# Patient Record
Sex: Female | Born: 1955 | Race: White | Hispanic: No | Marital: Married | Smoking: Current every day smoker
Health system: Southern US, Academic
[De-identification: ages and names within clinical notes are randomized; demographics above are authoritative.]

## PROBLEM LIST (undated history)

## (undated) DIAGNOSIS — I10 Essential (primary) hypertension: Secondary | ICD-10-CM

## (undated) DIAGNOSIS — M3500 Sicca syndrome, unspecified: Secondary | ICD-10-CM

## (undated) DIAGNOSIS — I499 Cardiac arrhythmia, unspecified: Secondary | ICD-10-CM

## (undated) DIAGNOSIS — E119 Type 2 diabetes mellitus without complications: Secondary | ICD-10-CM

## (undated) DIAGNOSIS — E079 Disorder of thyroid, unspecified: Secondary | ICD-10-CM

## (undated) DIAGNOSIS — E785 Hyperlipidemia, unspecified: Secondary | ICD-10-CM

## (undated) HISTORY — DX: Type 2 diabetes mellitus without complications (CMS HCC): E11.9

## (undated) HISTORY — DX: Essential (primary) hypertension: I10

## (undated) HISTORY — PX: GALLBLADDER SURGERY: SHX652

## (undated) HISTORY — PX: HX APPENDECTOMY: SHX54

## (undated) HISTORY — DX: Cardiac arrhythmia, unspecified: I49.9

## (undated) HISTORY — DX: Disorder of thyroid, unspecified: E07.9

## (undated) HISTORY — DX: Sjogren syndrome, unspecified (CMS HCC): M35.00

## (undated) HISTORY — DX: Hyperlipidemia, unspecified: E78.5

---

## 2008-09-06 ENCOUNTER — Other Ambulatory Visit (HOSPITAL_COMMUNITY): Payer: Self-pay

## 2011-08-27 ENCOUNTER — Ambulatory Visit (INDEPENDENT_AMBULATORY_CARE_PROVIDER_SITE_OTHER): Payer: BC Managed Care – PPO | Admitting: Ophthalmology

## 2011-10-01 ENCOUNTER — Ambulatory Visit (INDEPENDENT_AMBULATORY_CARE_PROVIDER_SITE_OTHER): Payer: BC Managed Care – PPO | Admitting: Ophthalmology

## 2017-09-24 DIAGNOSIS — E039 Hypothyroidism, unspecified: Secondary | ICD-10-CM | POA: Insufficient documentation

## 2017-09-24 DIAGNOSIS — R5383 Other fatigue: Secondary | ICD-10-CM | POA: Insufficient documentation

## 2017-09-24 DIAGNOSIS — I10 Essential (primary) hypertension: Secondary | ICD-10-CM | POA: Insufficient documentation

## 2017-09-24 DIAGNOSIS — F17210 Nicotine dependence, cigarettes, uncomplicated: Secondary | ICD-10-CM | POA: Insufficient documentation

## 2018-09-23 DIAGNOSIS — E119 Type 2 diabetes mellitus without complications: Secondary | ICD-10-CM | POA: Insufficient documentation

## 2018-09-23 DIAGNOSIS — M3500 Sicca syndrome, unspecified: Secondary | ICD-10-CM | POA: Insufficient documentation

## 2018-10-06 ENCOUNTER — Other Ambulatory Visit (HOSPITAL_COMMUNITY): Payer: Self-pay

## 2018-10-06 DIAGNOSIS — I6529 Occlusion and stenosis of unspecified carotid artery: Secondary | ICD-10-CM

## 2018-10-23 ENCOUNTER — Ambulatory Visit (HOSPITAL_BASED_OUTPATIENT_CLINIC_OR_DEPARTMENT_OTHER): Payer: BC Managed Care – PPO | Admitting: Vascular Surgery

## 2018-10-23 ENCOUNTER — Other Ambulatory Visit: Payer: Self-pay

## 2018-10-23 ENCOUNTER — Encounter (HOSPITAL_COMMUNITY): Payer: Self-pay | Admitting: Vascular Surgery

## 2018-10-23 ENCOUNTER — Ambulatory Visit
Admission: RE | Admit: 2018-10-23 | Discharge: 2018-10-23 | Disposition: A | Discharge status: Home / Self Care | Payer: BC Managed Care – PPO | Source: Ambulatory Visit | Attending: Vascular Surgery | Admitting: Vascular Surgery

## 2018-10-23 DIAGNOSIS — F17210 Nicotine dependence, cigarettes, uncomplicated: Secondary | ICD-10-CM | POA: Insufficient documentation

## 2018-10-23 DIAGNOSIS — I6529 Occlusion and stenosis of unspecified carotid artery: Secondary | ICD-10-CM

## 2018-10-23 DIAGNOSIS — I6522 Occlusion and stenosis of left carotid artery: Secondary | ICD-10-CM | POA: Insufficient documentation

## 2018-10-23 DIAGNOSIS — E119 Type 2 diabetes mellitus without complications: Secondary | ICD-10-CM | POA: Insufficient documentation

## 2018-10-23 MED ORDER — ATORVASTATIN 20 MG TABLET
20.0000 mg | ORAL_TABLET | Freq: Every day | ORAL | 4 refills | Status: AC
Start: 2018-10-23 — End: ?

## 2018-10-23 NOTE — Progress Notes (Signed)
[facility]  Clinic History and Physical    Date: 10/23/2018  Patient: Kerry Riggs  DOB: [birth year]  PCP: Brandon Melnick, MD    Chief Complaint:   Chief Complaint   Patient presents with   . Carotid Stenosis       Subjective:     HPI: Kerry Riggs is a 63 y.o. right hand dominant White female who presents for evaluation of incidental finding of >70% carotid stenosis during work up for ear pain. The patient has a history significant for DM (recently diagnosed) and smoking (1ppd).     PMH:   Past Medical History:   Diagnosis Date   . Diabetes mellitus (CMS [place])    . Dyslipidemia    . Essential hypertension    . Irregular heartbeat    . Sjogren's syndrome (CMS [place])    . Thyroid disease            Family Hx:  Family Medical History:     Problem Relation (Age of Onset)    Coronary Artery Disease Brother    Hypertension (High Blood Pressure) Father              Medications:  Current Outpatient Medications   Medication Sig Dispense Refill   . aspirin (ECOTRIN) 81 mg Oral Tablet, Delayed Release (E.C.) Take 81 mg by mouth Once a day     . Candesartan-Hydrochlorothiazid 32-25 mg Oral Tablet Patient Takes 1/2 tab twice daily     . Cholecalciferol, Vitamin D3, (VITAMIN D) 25 mcg (1,000 unit) Oral Capsule Take by mouth     . Coenzyme Q10 (CO Q-10) 10 mg Oral Capsule Take by mouth     . ferrous sulfate (FERATAB) 324 mg (65 mg iron) Oral Tablet, Delayed Release (E.C.) Take 324 mg by mouth     . L. acidophilus/L. rhamnosus (PROBIOTIC ORAL) Take by mouth     . levothyroxine (SYNTHROID) 88 mcg Oral Tablet TAKE 1 TABLET BY MOUTH ONCE DAILY     . loratadine (CLARITIN) 10 mg Oral Tablet Take 10 mg by mouth Once a day     . metFORMIN (GLUCOPHAGE) 500 mg Oral Tablet TAKE 1 TABLET BY MOUTH ONCE DAILY     . metoprolol succinate (TOPROL-XL) 25 mg Oral Tablet Sustained Release 24 hr Take 12 mg by mouth Once a day Pt states that she takes 12 mg occasionally.     . montelukast (SINGULAIR) 10 mg Oral Tablet TAKE 1  TABLET BY MOUTH ONCE DAILY IN THE EVENING       No current facility-administered medications for this visit.        Allergies:  Allergies   Allergen Reactions   . Penicillins    . Sulfa (Sulfonamides)        Past Surgical Hx:  Past Surgical History:   Procedure Laterality Date   . GALLBLADDER SURGERY     . HX APPENDECTOMY             Social Hx:  Social History     Socioeconomic History   . Marital status: Married     Spouse name: Not on file   . Number of children: Not on file   . Years of education: Not on file   . Highest education level: Not on file   Occupational History   . Not on file   Social Needs   . Financial resource strain: Not on file   . Food insecurity     Worry:  Not on file     Inability: Not on file   . Transportation needs     Medical: Not on file     Non-medical: Not on file   Tobacco Use   . Smoking status: Current Every Day Smoker     Packs/day: 1.00     Years: 50.00     Pack years: 50.00     Types: Cigarettes     Start date: 10/22/1968   . Smokeless tobacco: Never Used   Substance and Sexual Activity   . Alcohol use: Not Currently   . Drug use: Not on file   . Sexual activity: Not on file   Lifestyle   . Physical activity     Days per week: Not on file     Minutes per session: Not on file   . Stress: Not on file   Relationships   . Social Wellsite geologistconnections     Talks on phone: Not on file     Gets together: Not on file     Attends religious service: Not on file     Active member of club or organization: Not on file     Attends meetings of clubs or organizations: Not on file     Relationship status: Not on file   . Intimate partner violence     Fear of current or ex partner: Not on file     Emotionally abused: Not on file     Physically abused: Not on file     Forced sexual activity: Not on file   Other Topics Concern   . Not on file   Social History Narrative   . 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  HEENT: as in HPI  Respiratory: negative for cough and shortness of breath  Cardiovascular: negative for chest pain, negative for claudication  Gastrointestinal: negative for post prandial pain  Genitourinary: negative for dysuria, continues to urinate  Musculoskeletal: positive for back problems/sciatic nerve  Neurological: negative for unilateral weakness or monocular blindness  Integumentary: negative for wounds or ulcers    Objective:    PHYSICAL EXAM:  Vitals: BP (!) 167/82   Pulse 76   Temp 36.6 C (97.9 F) (Thermal Scan)   Ht 1.565 m (5' 1.61")   Wt 68.1 kg (150 lb 2.1 oz)   SpO2 94%   BMI 27.80 kg/m       General: AA&O X3 Well developed and well nourished in no acute distress   HENT: Head is normocephalic, atraumatic   Eyes: Conjunctiva clear., Pupils equal and round. , Sclera non-icteric. EOMI  Neck: Normal ROM, Supple, symmetrical  Lungs: Effort normal, clear to auscultation bilaterally.   Cardiovascular: Heart regular rate and rhythm, S1, S2 normal, no murmur, click, rub or gallop  Vascular:  no carotid bruits  Left dorsalis pedis artery:  2+ (normal), Right dorsalis pedis artery:  2+ (normal),    Left posterior tibial artery: 2+ (normal) Right posterior tibeal artery:  2+ (normal)   Extremities: Feet without structural abnormality. 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   Skin: First toes onychomycosis bilaterally.  Neurologic: Grossly normal, no focal neuro deficit, normal coordination and gait  Psychiatric: normal affect, behavior, memory, thought content, judgement, and speech    DATA:   I independently reviewed and interpreted the images.  DIAGNOSTIC STUDIES REVIEWED:  Carotid duplex from outside hospital report reviewed, with PSV over 300 and EDV over 100 on the left.   Carotid duplex performed at our hospital 10/23/2018: consistent with these results.       Assessment:  63 year old female with >70% asymptomatic left carotid stenosis    Plan:  1)  Will  follow up in the next few weeks with CTA to assess anatomy and to get preop testing with plans likely for CEA.   2)  Patient given information on diabetic foot care, will refer to podiatry due to evidence of LOPS and onychomycosis.   3) Counseled patient about the importance of smoking cessation, particularly in the setting of their vascular disease (2-10 minutes spent counseling). [facility]. Referral will be faxed.   4) Will initiate statin for pleitropic effects/medical optimization, Lipitor 20 mg. Will have PCP follow up labs.       Patient was given the opportunity to ask questions and those questions were answered to their satisfaction. Instructed to call with any further questions or concerns.     Troy SineSamantha Maveryk Renstrom, MD

## 2018-10-26 LAB — CAROTID ARTERY DUPLEX
Left CCA dist dias: 19 cm/s
Left CCA dist sys: 86 cm/s
Left CCA mid dias: 13
Left CCA mid dias: 21
Left CCA mid sys: 72
Left CCA prox dias: 18 cm/s
Left CCA prox sys: 95 cm/s
Left Carotid Bubl Sys: 80
Left Carotid Bulb Dias: 21
Left ECA sys: 198 cm/s
Left ICA dist dias: 24 cm/s
Left ICA dist sys: 63 cm/s
Left ICA mid dias: 26 cm/s
Left ICA mid sys: 77 cm/s
Left ICA prox dias: 146 cm/s
Left ICA prox sys: 327 cm/s
Left ICA/CCA sys: 3.8
Left vertebral sys: 57 cm/s
Right CCA dist dias: 18 cm/s
Right CCA mid sys: 94
Right CCA prox dias: 20 cm/s
Right CCA prox sys: 86 cm/s
Right Carotid Bulb Dias: 26
Right Carotid Bulb Sys: 98
Right ICA dist dias: 30 cm/s
Right ICA dist sys: 87 cm/s
Right ICA mid dias: 31 cm/s
Right ICA mid sys: 91 cm/s
Right ICA prox dias: 31 cm/s
Right ICA prox sys: 119 cm/s
Right ICA/CCA sys: 1.5 cm/s
Right cca dist sys: 78 cm/s
Right eca sys: 180 cm/s
Right vertebral sys: 45 cm/s

## 2018-11-09 ENCOUNTER — Other Ambulatory Visit (HOSPITAL_COMMUNITY): Payer: Self-pay

## 2018-11-09 ENCOUNTER — Ambulatory Visit (HOSPITAL_BASED_OUTPATIENT_CLINIC_OR_DEPARTMENT_OTHER)
Admission: RE | Admit: 2018-11-09 | Discharge: 2018-11-09 | Disposition: A | Discharge status: Home / Self Care | Payer: BC Managed Care – PPO | Source: Ambulatory Visit

## 2018-11-09 ENCOUNTER — Ambulatory Visit
Admission: RE | Admit: 2018-11-09 | Discharge: 2018-11-09 | Disposition: A | Discharge status: Home / Self Care | Payer: BC Managed Care – PPO | Source: Ambulatory Visit | Attending: Vascular Surgery | Admitting: Vascular Surgery

## 2018-11-09 ENCOUNTER — Other Ambulatory Visit: Payer: Self-pay

## 2018-11-09 DIAGNOSIS — I6529 Occlusion and stenosis of unspecified carotid artery: Secondary | ICD-10-CM

## 2018-11-09 DIAGNOSIS — I6523 Occlusion and stenosis of bilateral carotid arteries: Secondary | ICD-10-CM | POA: Insufficient documentation

## 2018-11-09 DIAGNOSIS — I708 Atherosclerosis of other arteries: Secondary | ICD-10-CM | POA: Insufficient documentation

## 2018-11-09 DIAGNOSIS — R911 Solitary pulmonary nodule: Secondary | ICD-10-CM | POA: Insufficient documentation

## 2018-11-09 MED ORDER — IOPAMIDOL 370 MG IODINE/ML (76 %) INTRAVENOUS SOLUTION
50.00 mL | INTRAVENOUS | Status: AC
Start: 2018-11-09 — End: 2018-11-09
  Administered 2018-11-09: 16:00:00 50 mL via INTRAVENOUS

## 2018-11-09 NOTE — Nurses Notes (Signed)
Patient in for CTA intracranial w IV contrast. Patient states that she had an allergic reaction to IV contrast 40+ years ago and has not had IV contrast since then. Patient doesn't remember what type of reaction she had. States that all she remembers is that she woke up from her surgery and was extremely sick to her stomach and she had been told that she had an allergic reaction to the dye. Paged and notified Dr. Anthony Sar. Per Dr. Anthony Sar and Dr. Jasper Riling, okay to proceed with patient's scan. Will accompany and monitor patient for any s/s of reaction during scan.

## 2018-11-11 LAB — POC ISTAT CREATININE (RESULT): CREATININE, POC: 0.8 mg/dL (ref 0.49–1.10)

## 2018-11-13 ENCOUNTER — Encounter (HOSPITAL_COMMUNITY): Payer: Self-pay | Admitting: Vascular Surgery

## 2018-11-16 ENCOUNTER — Other Ambulatory Visit (HOSPITAL_COMMUNITY): Payer: Self-pay

## 2018-11-16 DIAGNOSIS — R918 Other nonspecific abnormal finding of lung field: Secondary | ICD-10-CM

## 2018-11-27 ENCOUNTER — Encounter (HOSPITAL_COMMUNITY): Payer: Self-pay | Admitting: Vascular Surgery

## 2018-11-27 ENCOUNTER — Encounter (INDEPENDENT_AMBULATORY_CARE_PROVIDER_SITE_OTHER): Payer: Self-pay

## 2018-11-27 ENCOUNTER — Encounter (HOSPITAL_BASED_OUTPATIENT_CLINIC_OR_DEPARTMENT_OTHER): Payer: Self-pay | Admitting: Thoracic Surgery (Cardiothoracic Vascular Surgery)

## 2018-11-27 ENCOUNTER — Ambulatory Visit (HOSPITAL_BASED_OUTPATIENT_CLINIC_OR_DEPARTMENT_OTHER): Payer: BC Managed Care – PPO | Admitting: Thoracic Surgery (Cardiothoracic Vascular Surgery)

## 2018-11-27 ENCOUNTER — Ambulatory Visit: Payer: BC Managed Care – PPO | Attending: Vascular Surgery | Admitting: Vascular Surgery

## 2018-11-27 ENCOUNTER — Other Ambulatory Visit: Payer: Self-pay

## 2018-11-27 VITALS — BP 149/87 | HR 95 | Temp 97.3°F | Resp 18 | Ht 62.0 in | Wt 144.6 lb

## 2018-11-27 VITALS — BP 177/97 | HR 91 | Temp 97.5°F | Ht 61.5 in | Wt 145.1 lb

## 2018-11-27 DIAGNOSIS — Z01818 Encounter for other preprocedural examination: Secondary | ICD-10-CM

## 2018-11-27 DIAGNOSIS — F17210 Nicotine dependence, cigarettes, uncomplicated: Secondary | ICD-10-CM | POA: Insufficient documentation

## 2018-11-27 DIAGNOSIS — R918 Other nonspecific abnormal finding of lung field: Secondary | ICD-10-CM

## 2018-11-27 DIAGNOSIS — M3500 Sicca syndrome, unspecified: Secondary | ICD-10-CM | POA: Insufficient documentation

## 2018-11-27 DIAGNOSIS — I6522 Occlusion and stenosis of left carotid artery: Secondary | ICD-10-CM | POA: Insufficient documentation

## 2018-11-27 DIAGNOSIS — R0600 Dyspnea, unspecified: Secondary | ICD-10-CM

## 2018-11-27 DIAGNOSIS — I6529 Occlusion and stenosis of unspecified carotid artery: Secondary | ICD-10-CM

## 2018-11-27 DIAGNOSIS — I2510 Atherosclerotic heart disease of native coronary artery without angina pectoris: Secondary | ICD-10-CM

## 2018-11-27 DIAGNOSIS — E119 Type 2 diabetes mellitus without complications: Secondary | ICD-10-CM | POA: Insufficient documentation

## 2018-11-27 DIAGNOSIS — I10 Essential (primary) hypertension: Secondary | ICD-10-CM | POA: Insufficient documentation

## 2018-11-27 NOTE — Patient Instructions (Signed)
Thoracic Surgery  [address], [address]  [place], [place] [address]  Phone / [phone] ([place])  Fax / [phone]    Surgery Date:  12/24/2018, this date is subject to change due to the current pandemic.  You will be notified of any changes or any further pre-operative requirements.     Arrival Time for Surgery:  Someone will contact you by telephone one business day prior to surgery between 2:00 pm and 6:00 pm to discuss your arrival time for surgery.   (If you do not receive a phone call by 6:00 pm, please call [phone], option 4.)      Pre-Surgery Appointments:    Pre Admission Testing Appointment: (You can eat and drink the day of this PAT appointment) You will receive a phone call with the date and time of this appointment at [facility] on 12/22/2018.    COVID Testing:  Must be completed 2-3 days (48-72 hours) before the surgery.  You are to complete COVID testing at the [facility] site on 12/22/2018.      PFT (lung test) Appointment:  You will receive a phone call with the date and time of this appointment.    TTE (ECHO, heart test) Appointment:  You will receive a phone call with the date and time of this appointment.    PET Scan Appointment:  You will receive a phone call with the date and time of this appointment.    Stress Test Appointment:  You will receive a phone call with the date and time of this appointment.      Pre-Operative Assessment for COVID-19 and Patient Instructions: Tier 1      Nursing Assessment of Patient Factors:    1. Has the patient travelled out of state or to a community with high prevalence rate of COVID-19 within the previous 14 days?no  2. Has the patient been in household contact to a COVID-19 positive person? no  3. Does the patient reside in a nursing home with a known COVID-19 outbreak? no  4. Is the patient in prison? no  5. Does the patient have infiltrates on imaging not explained by other etiology? no  6. Per the CDC guidelines: Does  the patient have symptoms of a potential COVID-19 infection including fever not explained by other etiology, cough, shortness of breath, chills, myalgias, new loss of taste or smell, vomiting or diarrhea, and/or sore throat?no  7. Is the patient having any of the following procedures/surgeries? yes  o Intranasal/paranasal: including sinuses, septum, turbinates   o mastoid surgery which involves drilling   o Oral cavity: including dental, oral surgery   o oropharynx: including tonsils, EGD, TEE (done as intra-op or stand-alone procedure)   o hypopharynx   o larynx and trachea: including bronchoscopy   o lung resections   o esophageal resections   o transplant surgeries: solid organ and BMT   o CPAP titration in sleep lab studies     If YES was answered to any of the above questions, patient should be tested for COVID-19 pre-operatively no matter what type of surgery or anesthesia.       Instructions for ordering pre-operative COVID-19 test:    1. Order test in Epic: 'COVID-19 Screening - PREOP' if the test is to be completed at a [facility] locations:     Please call [place] [phone], option 4, then option 1) to confirm site times and dates of availability.  Days and times  listed below are subject to change.    [place]- Hours: 10 am - 6 pm (M-T) 10 am - 4 pm (W-F) 10 am - 2 pm (Sat)   [address]  [place], [place] [id number]      [place]- Call [phone] prior to visiting.   [address]  [place], [place] [id number]    2. Test needs to be completed 2-3 days (48-72 hours) prior to surgery.  Please [facility] COVID [facility] on 12/22/2018.  Testing completed before or after this time frame will not be counted for your pre-surgery requirement.    3.  If you have questions regarding COVID testing site hours or location please call the COVID Nurse Navigator at [phone], choose option 4, then choose option 1.    4. The  patient needs to remain in self-quarantine after COVID swab is completed.  Self-Quarantine includes:  Stay at home, no visitors, frequent handwashing, social distancing with other household members.      COVID Pre-operative Patient Instructions:    . The patient is to remain in self-quarantine for 48 hours prior to their surgical date or after the COVID test has been completed.   o If the patient is unable to fulfill the 48 hours requirement and please contact Thoracic Surgery for further instructions.    Surgery Instructions:  . ERAS- (Enhanced Recovery After Surgery) Please follow instructions given to you in Pre-Admission Testing and drink the protein drinks as instructed.  The drink the AM of surgery (clear ensure or 20 oz. of water for diabetic patients) please stop 2 hours prior to your arrival time.  (4 hours before surgery time)  Please report to the 1st floor of [facility] in [place] today and ask for your protein drinks for surgery.    . Please stop all vitamins, fish oils, herbal supplements, NSAIDS (such as Motrin, Ibuprofen, Aleve, Naprosyn, Naproxen, Mobic, Meloxicam, Celebrex, Celecoxib) 7 days before the surgery    . Please stop Aspirin 5 days before the surgery.    . All other Medication instructions will be given at the Pre-admission testing appointment by Anesthesia.     . Nothing to eat or drink after midnight the day of surgery.       Contact Information for Questions and Paperwork:     If you have any questions please call the Thoracic Surgery Nurses at [phone] and ask to speak Noemi ChapelAshley Striner-Jacobs, RN or Perrin SmackKrystal Pheonix Clinkscale, RN.     If you have any paperwork that needs to be completed for medical leave, please fax your paperwork to [phone], ATTN: Thoracic Surgery.     Consent completed in clinic.yes Consent to be completed AM of surgery.No

## 2018-11-27 NOTE — Progress Notes (Signed)
Thoracic Oncology Clinic, [place] [address]  [phone]    Visit Date: 11/27/2018    Referring Provider: No ref. provider found  PCP: Brandon Melnick, MD    Reason For Visit: Right upper lobe spiculated lung mass    History of Present Illness:  Kerry Riggs is a 63 y.o. White female who presents as a referral for evaluation management of right upper lobe spiculated lung nodule.  She affirms a 100 pack-year history of tobacco use as well as type 2 diabetes, hypertension, and hyperlipidemia.  She was undergoing a workup for carotid artery stenosis and had a CT scan of her neck performed.  The right upper lobe spiculated nodule was found incidentally on that CT scan and she was therefore referred to [place] for evaluation.  She denies any symptoms including fevers, chills, night sweats, unintentional weight loss, cough, shortness of breath, chest pain, or any other associated symptoms.  We discussed her tobacco use at length and he had recommended cessation of tobacco use.  She states that she is currently using a patch and it is helping some.  She has only smoked 2 or 3 cigarettes today.    History:   Past Medical History:   Diagnosis Date   . Diabetes mellitus (CMS [place])    . Dyslipidemia    . Essential hypertension    . Irregular heartbeat    . Sjogren's syndrome (CMS [place])    . Thyroid disease            Past Surgical History:   Procedure Laterality Date   . GALLBLADDER SURGERY     . HX APPENDECTOMY             Social History     Socioeconomic History   . Marital status: Married     Spouse name: Not on file   . Number of children: Not on file   . Years of education: Not on file   . Highest education level: Not on file   Tobacco Use   . Smoking status: Current Every Day Smoker     Packs/day: 1.00     Years: 50.00     Pack years: 50.00     Types: Cigarettes     Start date: 10/22/1968   . Smokeless tobacco: Never Used   Substance and Sexual Activity   . Alcohol use: Not Currently      . Drug use: Never     Family Medical History:     Problem Relation (Age of Onset)    Coronary Artery Disease Brother    Hypertension (High Blood Pressure) Father              Current Outpatient Medications:   Current Outpatient Medications   Medication Sig Dispense Refill   . aspirin (ECOTRIN) 81 mg Oral Tablet, Delayed Release (E.C.) Take 81 mg by mouth Once a day     . atorvastatin (LIPITOR) 20 mg Oral Tablet Take 1 Tab (20 mg total) by mouth Once a day 90 Tab 4   . Candesartan-Hydrochlorothiazid 32-25 mg Oral Tablet Patient Takes 1/2 tab twice daily     . Cholecalciferol, Vitamin D3, (VITAMIN D) 25 mcg (1,000 unit) Oral Capsule Take by mouth     . Coenzyme Q10 (CO Q-10) 10 mg Oral Capsule Take by mouth     . ferrous sulfate (FERATAB) 324 mg (65 mg iron) Oral Tablet, Delayed Release (E.C.) Take 324 mg by mouth     .  L. acidophilus/L. rhamnosus (PROBIOTIC ORAL) Take by mouth     . levothyroxine (SYNTHROID) 88 mcg Oral Tablet TAKE 1 TABLET BY MOUTH ONCE DAILY     . loratadine (CLARITIN) 10 mg Oral Tablet Take 10 mg by mouth Once a day     . metFORMIN (GLUCOPHAGE) 500 mg Oral Tablet TAKE 1 TABLET BY MOUTH ONCE DAILY     . metoprolol succinate (TOPROL-XL) 25 mg Oral Tablet Sustained Release 24 hr Take 12 mg by mouth Once a day Pt states that she takes 12 mg occasionally.     . montelukast (SINGULAIR) 10 mg Oral Tablet TAKE 1 TABLET BY MOUTH ONCE DAILY IN THE EVENING       No current facility-administered medications for this visit.        Allergies:  Allergies   Allergen Reactions   . Penicillins    . Sulfa (Sulfonamides)        Review of Systems:   Other than ROS in the HPI, all other systems were negative.    BP (!) 149/87 (Patient Position: Sitting) Comment: provider notified  Pulse 95   Temp 36.3 C (97.3 F) (Thermal Scan)   Resp 18   Ht 1.575 m (5\' 2" )   Wt 65.6 kg (144 lb 10 oz)   BMI 26.45 kg/m         Wt Readings from Last 3 Encounters:   11/27/18 65.6 kg (144 lb 10 oz)   11/27/18 65.8 kg (145 lb 1  oz)   10/23/18 68.1 kg (150 lb 2.1 oz)     Body mass index is 26.45 kg/m.      Physical Exam:  BP (!) 149/87 (Patient Position: Sitting) Comment: provider notified  Pulse 95   Temp 36.3 C (97.3 F) (Thermal Scan)   Resp 18   Ht 1.575 m (5\' 2" )   Wt 65.6 kg (144 lb 10 oz)   BMI 26.45 kg/m       Wt Readings from Last 3 Encounters:   11/27/18 65.6 kg (144 lb 10 oz)   11/27/18 65.8 kg (145 lb 1 oz)   10/23/18 68.1 kg (150 lb 2.1 oz)       General: no acute distress, appears stated age  Head: normocephalic, atraumatic  Eyes anicteric, extraocular movements are intact  Neck: trachea midline, no adenopathy, no masses, no JVD  Chest: clear bilaterally, distant breath sounds, normal excursion   Heart: RRR, no rubs or gallops, first and second heart sounds audible  Abdomen: soft, non-tender, non-distended, no hepatosplenomegaly, masses, or hernia   Lymphatics: no palpable adenopathy  Extremeties: no edema, cyanosis, or ulcerations  Skin: normal turgor, non-icteric, no obvious rashes  Neuro: no focal neurological deficit, no tremor  Psych: normal affect and judgement      Labs:  I have reviewed all lab values and culture results. Pertinent results are as below:  No components found for: CBC  No components found for: CMP  No results found for: INR      Radiology (personally reviewed and discussed with the patient):    CT :  Direct visualization of the images on [place] pacs on 11/27/2018 shows right upper lobe spiculated lung nodule concerning for malignancy.  No other evidence of malignancy.         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    Eric Formhristopher Burgan, PA-C    I both saw and examined the patient in  concert with Vernia Buffhris Burgan, PA-C. All pertinent imaging studies, including historical comparative studies, were personally reviewed. See his note for details. Any exceptions or additions are edited/noted. My assessment and outline of care plan was discussed in detail with the patient.    Mrs. 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  Associated asymptomatic carotid disease without indication for intervention at this time.   Proceed with completion of staging with combined PETCT. If no metastatic disease, complete functional workup with pulmonary function testing and myocardial perfusion study to assess cardiopulmonary reserve. Tentatively plan bronchoscopy, robotic/VATS right upper lobectomy, mediastinal lymph node dissection in the near future.  She was counseled about the significant benefit to smoking cessation prior to surgery.    The indications, risks, benefits, and alternatives of lung resection were discussed with the patient and her son  in detail including, but not limited to, bleeding, infection, pain, pneumonia, pneumothorax, air leak, empyema, need for thoracotomy, ventilator dependence, or even death. They voiced understanding of the principles involved, asked appropriate questions, and agrees to proceed.     Valarie MerinoJason Lamb, MD

## 2018-11-27 NOTE — Progress Notes (Signed)
[facility]  Clinic History and Physical    Date: 11/27/2018  Patient: Kerry Riggs  DOB: [birth year]  PCP: Brandon Melnick, MD    Chief Complaint:   Chief Complaint   Patient presents with   . Carotid Stenosis       Subjective:     HPI: Kerry Riggs is a 63 y.o. White female who was seen for evaluation of >70% asymptomatic left carotid stenosis on duplex. CTA head/neck demonstrated only 50% stenosis, but incidentally identified a lung mass highly suspicious for malignancy. Patient states she followed up with diabetic foot care and ordered a pair of shoes. Patient states she has not been in contact with the [facility]. Patient reports no symptoms of TIA/CVA at this time.       PMH:   Past Medical History:   Diagnosis Date   . Diabetes mellitus (CMS [place])    . Dyslipidemia    . Essential hypertension    . Irregular heartbeat    . Sjogren's syndrome (CMS [place])    . Thyroid disease        Family Hx:  Family Medical History:     Problem Relation (Age of Onset)    Coronary Artery Disease Brother    Hypertension (High Blood Pressure) Father          Medications:  Current Outpatient Medications   Medication Sig Dispense Refill   . aspirin (ECOTRIN) 81 mg Oral Tablet, Delayed Release (E.C.) Take 81 mg by mouth Once a day     . atorvastatin (LIPITOR) 20 mg Oral Tablet Take 1 Tab (20 mg total) by mouth Once a day 90 Tab 4   . Candesartan-Hydrochlorothiazid 32-25 mg Oral Tablet Patient Takes 1/2 tab twice daily     . Cholecalciferol, Vitamin D3, (VITAMIN D) 25 mcg (1,000 unit) Oral Capsule Take by mouth     . Coenzyme Q10 (CO Q-10) 10 mg Oral Capsule Take by mouth     . ferrous sulfate (FERATAB) 324 mg (65 mg iron) Oral Tablet, Delayed Release (E.C.) Take 324 mg by mouth     . L. acidophilus/L. rhamnosus (PROBIOTIC ORAL) Take by mouth     . levothyroxine (SYNTHROID) 88 mcg Oral Tablet TAKE 1 TABLET BY MOUTH ONCE DAILY     . loratadine (CLARITIN) 10 mg Oral Tablet Take 10 mg by mouth Once a  day     . metFORMIN (GLUCOPHAGE) 500 mg Oral Tablet TAKE 1 TABLET BY MOUTH ONCE DAILY     . metoprolol succinate (TOPROL-XL) 25 mg Oral Tablet Sustained Release 24 hr Take 12 mg by mouth Once a day Pt states that she takes 12 mg occasionally.     . montelukast (SINGULAIR) 10 mg Oral Tablet TAKE 1 TABLET BY MOUTH ONCE DAILY IN THE EVENING       No current facility-administered medications for this visit.        Allergies:  Allergies   Allergen Reactions   . Penicillins    . Sulfa (Sulfonamides)        Past Surgical Hx:  Past Surgical History:   Procedure Laterality Date   . GALLBLADDER SURGERY     . HX APPENDECTOMY         Social Hx:  Social History     Socioeconomic History   . Marital status: Married     Spouse name: Not on file   . Number of children: Not on file   . Years  of education: Not on file   . Highest education level: Not on file   Occupational History   . Not on file   Social Needs   . Financial resource strain: Not on file   . Food insecurity     Worry: Not on file     Inability: Not on file   . Transportation needs     Medical: Not on file     Non-medical: Not on file   Tobacco Use   . Smoking status: Current Every Day Smoker     Packs/day: 1.00     Years: 50.00     Pack years: 50.00     Types: Cigarettes     Start date: 10/22/1968   . Smokeless tobacco: Never Used   Substance and Sexual Activity   . Alcohol use: Not Currently   . Drug use: Not on file   . Sexual activity: Not on file   Lifestyle   . Physical activity     Days per week: Not on file     Minutes per session: Not on file   . Stress: Not on file   Relationships   . Social Wellsite geologistconnections     Talks on phone: Not on file     Gets together: Not on file     Attends religious service: Not on file     Active member of club or organization: Not on file     Attends meetings of clubs or organizations: Not on file     Relationship status: Not on file   . Intimate partner violence     Fear of current or ex partner: Not on file     Emotionally abused: Not  on file     Physically abused: Not on file     Forced sexual activity: Not on file   Other Topics Concern   . Not on file   Social History Narrative   . Not on file       Review of Systems:  Constitutional: negative for fevers, chills, sweats and fatigue  Respiratory: negative for cough and shortness of breath  Cardiovascular: negative for chest pain, negative for claudication  Neurological: negative for unilateral weakness or monocular blindness    Objective:    PHYSICAL EXAM:  Vitals: BP (!) 177/97   Pulse 91   Temp 36.4 C (97.5 F) (Thermal Scan)   Ht 1.562 m (5' 1.5")   Wt 65.8 kg (145 lb 1 oz)   SpO2 94%   BMI 26.97 kg/m       General: AA&O X3 Well developed and well nourished in no acute distress   Lungs: Effort normal, clear to auscultation bilaterally.   Cardiovascular: Heart regular rate and rhythm, S1, S2 normal, no murmur, click, rub or gallop  Vascular:  no carotid bruits  Extremities: no cyanosis or edema  Skin: Skin warm and dry  Neurologic: CN2-CN12 intact  Psychiatric: normal affect, behavior, memory, thought content, judgement, and speech    DATA:   I independently reviewed and interpreted the images.    DIAGNOSTIC STUDIES REVIEWED:  CT Angio Carotid-Extracranial W IV Contrast 11/09/2018  IMPRESSION:    1. Fairly extensive soft plaque in the left carotid bifurcation with about  50% stenosis of the left ICA origin. There is shallow plaque ulceration.  2. Milder atherosclerotic disease at the right carotid bifurcation with  only mild ICA stenosis.  3. Small amount of irregular finger like plaque in the proximal left  subclavian artery.  4. 1.7 cm spiculated right upper lobe lung nodule concerning for lung  malignancy.      Assessment:  63 year old female with asymptomatic ~50% stenosis of the left ICA and incidental finding of lung mass, highly suspicious for malignancy on CTA.     Plan:  1. CTA & US of the neck in 6 months   2. Patient following with Dr. Randa LynnLamb of Thoracic for lung mass  3.  Counseled patient about the importance of smoking cessation, particularly in the setting of their vascular disease (2-10 minutes spent counseling).     Patient was given the opportunity to ask questions and those questions were answered to their satisfaction. Instructed to call with any further questions or concerns.     I am scribing for, and in the presence of, Dr. Vivien RotaMinc for services provided on 11/27/2018.  Callie FieldingBraeden Carroll, SCRIBE    I personally performed the services described in this documentation, as scribed  in my presence, and it is both accurate  and complete.    Troy SineSamantha Jniya Madara, MD

## 2018-11-30 ENCOUNTER — Other Ambulatory Visit (HOSPITAL_COMMUNITY): Payer: Self-pay | Admitting: Thoracic Surgery (Cardiothoracic Vascular Surgery)

## 2018-11-30 ENCOUNTER — Telehealth (HOSPITAL_COMMUNITY): Payer: Self-pay | Admitting: Thoracic Surgery (Cardiothoracic Vascular Surgery)

## 2018-11-30 DIAGNOSIS — R0600 Dyspnea, unspecified: Secondary | ICD-10-CM

## 2018-11-30 DIAGNOSIS — R911 Solitary pulmonary nodule: Secondary | ICD-10-CM

## 2018-11-30 DIAGNOSIS — I10 Essential (primary) hypertension: Secondary | ICD-10-CM

## 2018-11-30 NOTE — Progress Notes (Signed)
Left patient a message at 1132am on 11/30/18. Called patient again at 1234pm on 11/30/18 and spoke with her and confirmed procedure for 12/24/18. She confirmed she spoke with someone about the COVID and PEC to be done in [place] as well.   Lucie Leather  11/30/2018, 12:43

## 2018-12-09 ENCOUNTER — Other Ambulatory Visit (HOSPITAL_COMMUNITY): Payer: Self-pay | Admitting: Thoracic Surgery (Cardiothoracic Vascular Surgery)

## 2018-12-09 DIAGNOSIS — R911 Solitary pulmonary nodule: Secondary | ICD-10-CM

## 2018-12-09 DIAGNOSIS — R0600 Dyspnea, unspecified: Secondary | ICD-10-CM

## 2018-12-10 ENCOUNTER — Encounter (INDEPENDENT_AMBULATORY_CARE_PROVIDER_SITE_OTHER): Payer: Self-pay | Admitting: NURSE PRACTITIONER-ADULT HEALTH

## 2018-12-11 ENCOUNTER — Ambulatory Visit (HOSPITAL_COMMUNITY): Payer: Self-pay | Admitting: Thoracic Surgery (Cardiothoracic Vascular Surgery)

## 2018-12-11 DIAGNOSIS — E8881 Metabolic syndrome: Secondary | ICD-10-CM | POA: Insufficient documentation

## 2018-12-11 DIAGNOSIS — J309 Allergic rhinitis, unspecified: Secondary | ICD-10-CM | POA: Insufficient documentation

## 2018-12-11 NOTE — Telephone Encounter (Signed)
Message from [place] sent at 12/11/2018 9:59 AM EDT     Summary: patient of Dr Arline Asp: Requesting status about Kerry Riggs     Patient of Dr Arline Asp     Patient's daughter Kerry Riggs is calling in wanting to know the status of her FMLA paperwork.     She is requesting a phone call back at [phone].     Thank You             Call History      Type  Contact    12/11/2018 09:58 AM EDT  Phone (Incoming)  Glenford Peers    Phone: [phone]    User: Wille Glaser    patient of Dr Arline Asp: Requesting status about FLMA Paperwork   Received: Today   Message Contents   [facility]      RN called Kerry Riggs back and she stated she only need the 17th-24th off from work then afterwards would take paid leave off work. I told her we would fill it out, get it signed and faxed back in. She verbalized understanding.

## 2018-12-16 ENCOUNTER — Encounter (INDEPENDENT_AMBULATORY_CARE_PROVIDER_SITE_OTHER): Payer: Self-pay | Admitting: NURSE PRACTITIONER-ADULT HEALTH

## 2018-12-18 ENCOUNTER — Ambulatory Visit (INDEPENDENT_AMBULATORY_CARE_PROVIDER_SITE_OTHER): Payer: BC Managed Care – PPO | Admitting: NUCLEAR MEDICINE

## 2018-12-18 ENCOUNTER — Ambulatory Visit (INDEPENDENT_AMBULATORY_CARE_PROVIDER_SITE_OTHER): Payer: BC Managed Care – PPO

## 2018-12-21 ENCOUNTER — Telehealth (INDEPENDENT_AMBULATORY_CARE_PROVIDER_SITE_OTHER): Payer: Self-pay | Admitting: NURSE PRACTITIONER-ADULT HEALTH

## 2018-12-21 ENCOUNTER — Encounter (HOSPITAL_BASED_OUTPATIENT_CLINIC_OR_DEPARTMENT_OTHER): Payer: Self-pay

## 2018-12-21 ENCOUNTER — Ambulatory Visit (HOSPITAL_BASED_OUTPATIENT_CLINIC_OR_DEPARTMENT_OTHER): Payer: BC Managed Care – PPO

## 2018-12-21 ENCOUNTER — Encounter (HOSPITAL_COMMUNITY): Payer: Self-pay

## 2018-12-21 NOTE — Patient Instructions (Addendum)
ERAS- (Enhanced Recovery After Surgery) Please follow instructions given to you in Pre-Admission Testing and drink the protein drinks as instructed.  The drink the AM of surgery (clear ensure or 20 oz. of water for diabetic patients) please stop 2 hours prior to your arrival time.  (4 hours before surgery time)      Drink two Ensure Surgery shakes a day beginning 5 days prior to surgery. Drink the last shake the evening before surgery.     Please stop all vitamins, fish oils, herbal supplements, NSAIDS (such as Motrin, Ibuprofen, Aleve, Naprosyn, Naproxen, Mobic, Meloxicam, Celebrex, Celecoxib) 7 days before the surgery    Please stop Aspirin 5 days before the surgery.    All other Medication instructions will be given at the Pre-admission testing appointment by Anesthesia.     Nothing to eat or drink after midnight the day of surgery.      Peri-operative medication recommendations:  ? Anticoagulation holding/resumption:               Hold aspirin 5 days prior to surgery per service.    ? Take these medications the morning of your procedure:               Atorvastatin, levothyroxine, metoprolol, montelukast, loratadine                 ? HOLD these medication the morning of your procedure:               Candesartan-HCTZ, metformin

## 2018-12-21 NOTE — Nursing Note (Signed)
Patient's medication list, medical history, allergies, and past surgical history reviewed. Reviewed patient's current pharmacy and is correct for e-scribing. Discussed intended surgery date. Diet and Other Important Instructions, Skin Preparation, [facility] Consent for Anesthesia, and Pre and Post Surgery Nutrition Plan forms discussed and given to patient.

## 2018-12-21 NOTE — Telephone Encounter (Signed)
Patient did not show for her 2:15 pm appt today. She was notified by myself and states that she has had a lot of appointments and feels that she would like to get a second opinion before having surgery on Wednesday. She wishes to cancel the surgery at this time. She was offered the number to thoracic surgery department for her to call with questions or to reschedule, she states that she has it and will notify them.

## 2018-12-21 NOTE — H&P (Deleted)
[facility]  [place], [place]  [id number]  [phone]      Name: Kerry Riggs Date of Service: 12/21/2018   MRN: [id number] DOB: Apr 27, 1955     TELEMEDICINE DOCUMENTATION:    Patient Location:  Specialty [address], [address], [place], [place] [address]    Patient/family aware of provider location:  yes  Patient/family consent for telemedicine:  yes  Examination observed and performed by:  Violeta Gelinas, APRN      Date of Surgery: 12/24/18  Referring Physician/Surgeon: Dr. Arline Asp  Anesthesia planned: MAC/General     Chief Complaint   Patient presents with   . Pre-op Evaluation     Lung Nodule DOS 12/24/2018        Kerry Riggs is a 63 y.o. year old female presenting to the [facility] for a preoperative medical consultation. She is scheduled for a bronchoscopy and robotic right upper lung lobectomy with mediastinal lymph node dissection under general anesthesia on 12/24/18. She has a history of right upper lobe spiculated lung nodule found on a CT scan of neck that was being preformed for carotid artery stenosis. She has a history of hypertension, DM type II, Sjogren's syndrome, metabolic syndrome X and hyperlipidemia. She is currently a one pack a day smoker.     No known personal or family history of anesthesia complications.    CT of Neck 11/09/2018    FINDINGS:    There is atherosclerotic plaque and mild calcification at the  brachiocephalic artery origin with mild stenosis. Similar mild calcified  and soft plaque is seen at the right subclavian artery origin with mild  stenosis. There is mild atherosclerotic plaque at the left subclavian  artery origin without stenosis. There is small amount of finger like plaque  at the proximal left ICA. The left vertebral artery is a little larger. The  cervical vertebral arteries are patent without stenosis. The right common  carotid artery is patent without stenosis. There is soft plaque  at the  right carotid bifurcation proximal right ICA with some calcification. There  is only mild, less than 25% stenosis of the right ICA origin. The left  common carotid artery is patent without stenosis. There is extensive soft  plaque with shallow ulceration in the left carotid bifurcation and proximal  left ICA. The degree of stenosis of the left ICA origin is about 50%. There  is otherwise minimal calcified atherosclerotic disease in the distal left  cervical ICA.    There is a 1.7 cm spiculated nodule in the peripheral right upper lobe.    IMPRESSION:    1. Fairly extensive soft plaque in the left carotid bifurcation with about  50% stenosis of the left ICA origin. There is shallow plaque ulceration.  2. Milder atherosclerotic disease at the right carotid bifurcation with  only mild ICA stenosis.  3. Small amount of irregular finger like plaque in the proximal left  subclavian artery.  4. 1.7 cm spiculated right upper lobe lung nodule concerning for lung  Malignancy.      Carotid Artery Duplex 10/23/18   Right ICA : Less than 50% stenosis - Mild  Right ECA: Less than 50% diameter reduction  Right Vertebral: Normal, Antegrade    Left ICA:  70-99% stenosis - Severe  Left ECA: Less than 50% diameter reduction  Left Vertebral: Normal, Antegrade  Conclusion:  Left internal carotid artery with 70-99% stenosis - Severe.    Medical History  Past Medical History:   Diagnosis Date   . Diabetes mellitus (CMS [place])    . Dyslipidemia    . Essential hypertension    . Irregular heartbeat    . Sjogren's syndrome (CMS [place])    . Thyroid disease        Past Surgical History:   Procedure Laterality Date   . GALLBLADDER SURGERY     . HX APPENDECTOMY       aspirin (ECOTRIN) 81 mg Oral Tablet, Delayed Release (E.C.), Take 81 mg by mouth Once a day  atorvastatin (LIPITOR) 20 mg Oral Tablet, Take 1 Tab (20 mg total) by mouth Once a day  Candesartan-Hydrochlorothiazid 32-25 mg Oral Tablet, Patient Takes 1/2 tab twice  daily  Cholecalciferol, Vitamin D3, (VITAMIN D) 25 mcg (1,000 unit) Oral Capsule, Take by mouth  Coenzyme Q10 (CO Q-10) 10 mg Oral Capsule, Take by mouth  ferrous sulfate (FERATAB) 324 mg (65 mg iron) Oral Tablet, Delayed Release (E.C.), Take 324 mg by mouth  L. acidophilus/L. rhamnosus (PROBIOTIC ORAL), Take by mouth  levothyroxine (SYNTHROID) 88 mcg Oral Tablet, TAKE 1 TABLET BY MOUTH ONCE DAILY  loratadine (CLARITIN) 10 mg Oral Tablet, Take 10 mg by mouth Once a day  metFORMIN (GLUCOPHAGE) 500 mg Oral Tablet, TAKE 1 TABLET BY MOUTH ONCE DAILY  metoprolol succinate (TOPROL-XL) 25 mg Oral Tablet Sustained Release 24 hr, Take 12 mg by mouth Once a day Pt states that she takes 12 mg occasionally.  montelukast (SINGULAIR) 10 mg Oral Tablet, TAKE 1 TABLET BY MOUTH ONCE DAILY IN THE EVENING    No facility-administered medications prior to visit.       Allergies   Allergen Reactions   . Penicillins    . Sulfa (Sulfonamides)         Social History     Socioeconomic History   . Marital status: Married     Spouse name: Not on file   . Number of children: Not on file   . Years of education: Not on file   . Highest education level: Not on file   Occupational History   . Not on file   Social Needs   . Financial resource strain: Not on file   . Food insecurity     Worry: Not on file     Inability: Not on file   . Transportation needs     Medical: Not on file     Non-medical: Not on file   Tobacco Use   . Smoking status: Current Every Day Smoker     Packs/day: 1.00     Years: 50.00     Pack years: 50.00     Types: Cigarettes     Start date: 10/22/1968   . Smokeless tobacco: Never Used   . Tobacco comment: 1800quitline card given   Substance and Sexual Activity   . Alcohol use: Not Currently   . Drug use: Never   . Sexual activity: Not on file   Lifestyle   . Physical activity     Days per week: Not on file     Minutes per session: Not on file   . Stress: Not on file   Relationships   . Social Product manager on phone: Not  on file     Gets together: Not on file     Attends religious service: Not on file     Active member of club or organization: Not on file     Attends  meetings of clubs or organizations: Not on file     Relationship status: Not on file   . Intimate partner violence     Fear of current or ex partner: Not on file     Emotionally abused: Not on file     Physically abused: Not on file     Forced sexual activity: Not on file   Other Topics Concern   . Not on file   Social History Narrative   . Not on file      Substance Abuse History  _0  Alcohol  _1  Cannibas  _2  Opioids  _3  Methadone  _4  Suboxone  _5  Subutex  _6  Tobacco    Family Medical History:     Problem Relation (Age of Onset)    Coronary Artery Disease Brother    Hypertension (High Blood Pressure) Father          Review of Systems     Constitutional: denies anorexia, reports fatigue, denies fevers, denies chills, night sweats, reports weight loss, denies weight gain.  Eyes: denies blurry vision, double vision, or eye pain.   ENT: denies difficulty swallowing, epistaxis, nasal discharge, oral lesions, tinnitus, or vocal changes.  Cardiovascular: denies chest pain, chest pressure/discomfort, irregular heartbeat, denies lower extremity edema, denies orthopnea, denies palpitations, denies syncope.  Respiratory: denies dry cough, denies dyspnea on exertion, denies emphysema, pleurisy/chest pain, sputum, or wheezing.  Gastrointestinal: denies abdominal pain, constipation, diarrhea, jaundice, melena, nausea, reflux symptoms, or vomiting.  Musculoskeletal: denies arthralgias, denies generalized muscle aches.   Neurological: denies dizziness with position changes, denies headache, denies gait problems, memory problems, speech problems, tremors, vertigo, or weakness.   Endocrine: denies hot flashes, mood swings, skin changes, temperature intolerance, or unexpected weight changes, denies change in appetite, denies sweating.   Hematologic/Lymphatic: denies bleeding problems,  denies easy bruising.   Allergic/Immunological: denies hives, insect bite sensitivity, or nasal congestion.  Dermatological: denies acne, eczema, lumps, rash, or skin lesion changes.  Genitourinary: denies any urinary urgency, denies incontinence, denies blood in urine.   Psychiatric: denies anxiety, denies depression.     Stop Artist (Screens for obstructive sleep apnea)  _7   DO YOU SNORE?  (Louder than talking or loud enough to be heard through closed doors)  _8   Do you often feel tired, fatigued, or sleepy during the daytime?  _9   Has anyone observed you stop breathing during sleep?  _10   Do you have (or are you being treated for) high blood pressure?  _11   BMI >35  _12   AGE >50yo  _13   Neck circumference >40cm  _14   Female    _15  OSA Low risk: yes to 0-2 questions  _16  OSA Intermediate: yes to 3-4 questions  _17  OSA High risk: yes to 5-8 questions      Physical Exam     Vitals: There were no vitals filed for this visit.    General: good health, appears stated age, no distress  Head & Neck  Head atraumatic and normocephalic    Facial Hair             _18  Beard _19  Facial Hair    Mallampati:  _20  I _21  II _22  III _23  IV    Neck ROM:  _24  Full _25  Limited    Oral Opening:  _26  Good _27  Fair _28  Poor _29  Fused/Wired    TM Distance _30  < 3 FB             _31  >3  FB    ENT:  No oral lesions, mucous membranes moist, uvula midline    Dental  _0  Edentulous  _1  Upper dentures  _2  Lower dentures  _3  Partial  _4  Implants  _5  Caps  _6  Braces  _7  Missing  _8  Loose  _9  Poor dentition    Lungs: breathing non-labored, respirations easy and regular, clear to auscultation bilaterally w/o wheeze/rales/ronchi  Cardiac: RRR, S1 S2 normal, no cyanosis or clubbing  Murmurs: ***  Click, rub or gallop: none  Carotid bruits: ***  Peripheral edema: ***  Musculoskeletal: moves all extremities equally  Skin: warm and dry, no rashes on exposed body surfaces  Neuro: Grossly normal, gait is normal  Psych: A&Ox3, normal affect, behavior and speech     Assessment & Plan    (Z01.818) Preop examination  (primary encounter diagnosis)  Plan: EKG 12 LEAD-Today (In Clinic Only)        ***       Frailty Index (The Frail Scale)  _10  F= Fatigue in the past month? (More unusual fatigue than normal or inability to complete ADLs)  _11  R= Resistance- difficulty climbing a flight of stairs?   _12  A= Ambulation- difficulty walking one block on level land?  _13   I= Illness (HTN, DM, Cancer (other than minor skin CA), Chronic Lung Disease, Heart Attack, CHF, Angina,   Asthma, Athritis, Stroke, CKD); 5+=1 point; <5=0  _14   L= Loss of weight- have you lost >5% of your body weight in the past year?  Total SCORE:   0= Robust health; Pre-Frail= 1-2; Frail= 3-4; Severely Frail= 5     Revised Cardiac Risk Index (RCRI)  _15  High risk surgery? (vascular, open intraperitoneal, or intrathoracic)  _16  History of ischemic heart disease (history of MI or positive exercise stress test, current c/o CP secondary to ischemia, use of nitrate therapy, or EKG with pathological Q wave;, revascularization procedure not to be counted unless one of the other criteria for ischemic heart disease is present.)  _17   History of CHF (pulmonary edema, B rales or S3 gallop, PND, CXR with pulmonary vascular redistribution)  _18   History of cerebrovascular disease (prior TIA or Stroke)  _19   DM requiring treatment with insulin  _20   Preoperative serum Cr >2.0 mg/dL  30d risk of cardiac death, nonfatal MI or nonfatal cardiac arrest:     Rate of cardiac death, nonfatal MI, and nonfatal cardiac arrest:   No risk factors- 0.4%   One risk factor- 1.0%  Two risk factors- 2.4%  Three or more risk factors- 5.4%        CONSULTS OR TESTING ORDERED/REQUIRED:      The patient was seen as part of a collaborative telemedicine service with Delphina Cahill, APRN who participated in the encounter by active presence via approved video/audio means for portions of the encounter.  Delphina Cahill, APRN and Violeta Gelinas, APRN discussed the physical assessment,  reviewed and interpreted all studies and the plan was mutually developed.      Violeta Gelinas, APRN

## 2018-12-21 NOTE — Progress Notes (Signed)
The patient did not appear for their appointment/or scheduled appointment was cancelled.  This office visit opened in error.

## 2018-12-24 ENCOUNTER — Inpatient Hospital Stay (HOSPITAL_COMMUNITY)
Admission: RE | Admit: 2018-12-24 | Payer: BC Managed Care – PPO | Source: Ambulatory Visit | Admitting: Thoracic Surgery (Cardiothoracic Vascular Surgery)

## 2018-12-24 ENCOUNTER — Ambulatory Visit (HOSPITAL_COMMUNITY): Payer: Self-pay | Admitting: Thoracic Surgery (Cardiothoracic Vascular Surgery)

## 2018-12-24 ENCOUNTER — Encounter (HOSPITAL_COMMUNITY): Admission: RE | Payer: Self-pay | Source: Ambulatory Visit

## 2018-12-24 SURGERY — LOBECTOMY LUNG ROBOTIC
Anesthesia: General | Site: Mouth | Laterality: Right

## 2018-12-24 NOTE — Telephone Encounter (Signed)
Message from [place] sent at 12/24/2018 3:26 PM EDT     Summary: pt of Dr. Ubaldo Glassing - call back     Patient called in and stated that she would like a call back from [facility] regarding a PET scan that she took.     Patient can be reached at [phone]     Thank you             Call History      Type  Contact  Phone  User    12/24/2018 03:25 PM EDT  Phone (Incoming)  Salome Arnt (Self)  [phone] (H)  Sapic, Darolyn Rua    pt of Dr. Ubaldo Glassing - call back   Received: Today   Message Contents   Sapic, [facility]      RN called patient and gave her the impression on the PET scan result from [facility], she verbalized understanding.

## 2019-03-09 LAB — LAB
ESTIMATED AVERAGE GLUCOSE: 131 mg/dL
Hemoglobin A1c: 6.2 % — ABNORMAL HIGH (ref 4.3–5.6)

## 2019-05-14 ENCOUNTER — Other Ambulatory Visit (HOSPITAL_COMMUNITY): Payer: Self-pay

## 2019-05-14 ENCOUNTER — Encounter (HOSPITAL_COMMUNITY): Payer: Self-pay | Admitting: Vascular Surgery

## 2019-07-30 ENCOUNTER — Other Ambulatory Visit (HOSPITAL_COMMUNITY): Payer: Self-pay

## 2019-07-30 ENCOUNTER — Encounter (HOSPITAL_COMMUNITY): Payer: Self-pay | Admitting: Vascular Surgery

## 2019-08-30 ENCOUNTER — Other Ambulatory Visit (HOSPITAL_COMMUNITY): Payer: Self-pay

## 2019-09-03 ENCOUNTER — Other Ambulatory Visit (HOSPITAL_COMMUNITY): Payer: Self-pay

## 2019-09-03 ENCOUNTER — Encounter (HOSPITAL_COMMUNITY): Payer: Self-pay | Admitting: Family

## 2019-09-24 ENCOUNTER — Encounter (HOSPITAL_COMMUNITY): Payer: Self-pay | Admitting: Family

## 2019-09-30 ENCOUNTER — Ambulatory Visit (HOSPITAL_BASED_OUTPATIENT_CLINIC_OR_DEPARTMENT_OTHER)
Admission: RE | Admit: 2019-09-30 | Discharge: 2019-09-30 | Disposition: A | Discharge status: Home / Self Care | Payer: BC Managed Care – PPO | Source: Ambulatory Visit

## 2019-09-30 ENCOUNTER — Other Ambulatory Visit: Payer: Self-pay

## 2019-09-30 ENCOUNTER — Ambulatory Visit (HOSPITAL_BASED_OUTPATIENT_CLINIC_OR_DEPARTMENT_OTHER): Payer: BC Managed Care – PPO | Admitting: Family

## 2019-09-30 ENCOUNTER — Encounter (HOSPITAL_COMMUNITY): Payer: Self-pay | Admitting: Family

## 2019-09-30 ENCOUNTER — Ambulatory Visit
Admission: RE | Admit: 2019-09-30 | Discharge: 2019-09-30 | Disposition: A | Discharge status: Home / Self Care | Payer: BC Managed Care – PPO | Source: Ambulatory Visit | Attending: Vascular Surgery | Admitting: Vascular Surgery

## 2019-09-30 VITALS — BP 140/81 | HR 96 | Temp 97.7°F | Ht 62.0 in | Wt 155.0 lb

## 2019-09-30 DIAGNOSIS — I6523 Occlusion and stenosis of bilateral carotid arteries: Secondary | ICD-10-CM | POA: Insufficient documentation

## 2019-09-30 DIAGNOSIS — I6529 Occlusion and stenosis of unspecified carotid artery: Secondary | ICD-10-CM

## 2019-09-30 LAB — CAROTID ARTERY DUPLEX
Left CCA dist dias: 19 cm/s
Left CCA dist sys: 90 cm/s
Left CCA mid dias: 16
Left CCA mid dias: 19
Left CCA mid sys: 79
Left CCA prox dias: 21 cm/s
Left CCA prox sys: 97 cm/s
Left Carotid Bubl Sys: 99
Left Carotid Bulb Dias: 21
Left ECA sys: 193 cm/s
Left ICA dist dias: 25 cm/s
Left ICA dist sys: 77 cm/s
Left ICA mid dias: 27 cm/s
Left ICA mid sys: 87 cm/s
Left ICA prox dias: 58 cm/s
Left ICA prox sys: 197 cm/s
Left ICA/CCA sys: 2.5
Left vertebral sys: 52 cm/s
Right CCA dist dias: 22 cm/s
Right CCA mid sys: 80
Right CCA prox dias: 18 cm/s
Right CCA prox sys: 87 cm/s
Right Carotid Bulb Dias: 15
Right Carotid Bulb Sys: 70
Right ICA dist dias: 24 cm/s
Right ICA dist sys: 77 cm/s
Right ICA mid dias: 25 cm/s
Right ICA mid sys: 79 cm/s
Right ICA prox dias: 29 cm/s
Right ICA prox sys: 101 cm/s
Right ICA/CCA sys: 1.3 cm/s
Right cca dist sys: 77 cm/s
Right eca sys: 174 cm/s
Right vertebral sys: 44 cm/s

## 2019-09-30 LAB — POC ISTAT CREATININE (RESULT): CREATININE, POC: 0.9 mg/dL (ref 0.49–1.10)

## 2019-09-30 MED ORDER — IOVERSOL 320 MG IODINE/ML INTRAVENOUS SOLUTION
50.0000 mL | INTRAVENOUS | Status: AC
Start: 2019-09-30 — End: 2019-09-30
  Administered 2019-09-30: 50 mL via INTRAVENOUS

## 2019-09-30 NOTE — Progress Notes (Signed)
Vascular Surgery  Follow-Up Clinic Note                    Date: 09/30/2019  Patient: Kerry Riggs  MRN: [id number]  DOB: April 18, 1955  PCP: Brandon Melnick, MD    Chief Complaint:   Chief Complaint   Patient presents with    Carotid Stenosis       Subjective:     HPI: Kerry Riggs is a 64 y.o. White female who presents for follow up of carotid stenosis surveillance and imaging. On last follow up the patient was found to have around 50% stenosis of the left ICA and a shallow plaque ulceration. Continual surveillance recommended, and patient is back today with imaging. The imaging at that time incidentally found a right lung mass. The patient states she followed at [facility] and was diagnosed with right lung CA stage 1. She had a lobectomy and states she recently followed up and was told that everything is still negative and clear. She has a follow up again this fall.  The patient denies any amaurosis fugax, aphasia, vision change, dizziness, syncope, weakness, numbness, tingling, TIAs, or CP. The patient says that she is smoking around 1/4-1/2 ppd. She states she is actively trying to quit. She states she is taking medications as prescribed.       Denies any new changes in PMHx, PSxH, Social Hx, FHx or medications since last visit       ROS:  General: Denies fevers, chills, sweats or fatigue  Eyes: Denies blurred vision or field defects  HEENT: Denies hearing loss   Respiratory: Denies cough, or shortness of breath  Cardiovascular: Denies chest pain or claudication  Gastrointestinal: Denies postprandial pain, nausea, vomiting, or problems with bowel movements.   Genitourinary: Denies dysuria, frequency, or trouble urinating   Musculoskeletal: As in HPI  Neurological: Denies unilateral weakness or monocular blindness  Integumentary: Denies wounds or ulcers  All other systems were negative     Allergies   Allergen Reactions    Penicillins     Sulfa (Sulfonamides)        Current Outpatient Medications      Medication Sig Dispense Refill    aspirin (ECOTRIN) 81 mg Oral Tablet, Delayed Release (E.C.) Take 81 mg by mouth Once a day      atorvastatin (LIPITOR) 20 mg Oral Tablet Take 1 Tab (20 mg total) by mouth Once a day 90 Tab 4    Candesartan-Hydrochlorothiazid 32-25 mg Oral Tablet Patient Takes 1/2 tab twice daily      Cholecalciferol, Vitamin D3, (VITAMIN D) 25 mcg (1,000 unit) Oral Capsule Take by mouth      Coenzyme Q10 (CO Q-10) 10 mg Oral Capsule Take by mouth      ferrous sulfate (FERATAB) 324 mg (65 mg iron) Oral Tablet, Delayed Release (E.C.) Take 324 mg by mouth      L. acidophilus/L. rhamnosus (PROBIOTIC ORAL) Take by mouth      levothyroxine (SYNTHROID) 88 mcg Oral Tablet TAKE 1 TABLET BY MOUTH ONCE DAILY      loratadine (CLARITIN) 10 mg Oral Tablet Take 10 mg by mouth Once a day      metFORMIN (GLUCOPHAGE) 500 mg Oral Tablet TAKE 1 TABLET BY MOUTH ONCE DAILY      metoprolol succinate (TOPROL-XL) 25 mg Oral Tablet Sustained Release 24 hr Take 12 mg by mouth Once a day Pt states that she takes 12 mg occasionally.      montelukast (SINGULAIR)  10 mg Oral Tablet TAKE 1 TABLET BY MOUTH ONCE DAILY IN THE EVENING       No current facility-administered medications for this visit.       Cardiovascular Medications:  ASA: Yes  Statin: Yes  Antiplatelets (Plavix, Brilinta, Effient): No  Anticoagulant: No    Objective:    Physical Exam:   Vitals: BP (!) 140/81 Comment: LA BP: 143/74   Pulse 96    Temp 36.5 C (97.7 F) (Thermal Scan)    Ht 1.575 m (5\' 2" )    Wt 70.3 kg (154 lb 15.7 oz)    SpO2 95%    BMI 28.35 kg/m       Constitutional: AA&O X3 Well developed and well nourished in no acute distress   HENT: Head is normocephalic, atraumatic   Eyes: Conjunctiva clear, Pupils equal, round and reactive to light; Sclera without icterus  Neck: Normal ROM, Supple, symmetrical  Respiratory: Effort normal, unlabored  Cardiovascular: Heart regular rate and rhythm,   Gastrointestinal: Bowel non distended non-tender to  palpation, no rebound or guarding present.   Extremities: no cyanosis or edema. Grip 5/5 bilaterally   Integumentary:  Skin warm and dry  Neurologic: Grossly normal, no focal neuro deficit, normal coordination and gait  Psychiatric: normal affect, behavior, memory, thought content, judgement, and speech.    Vascular Exam:    Carotid Bruits: No    Left Right   Brachial artery: 2+ (normal)  Radial artery: 2+ (normal)   Brachial artery: 2+ (normal)  Radial artery: 2+ (normal)       Laboratory:       Imaging Tests:  Carotid Duplex: official read pending  Right ICA : Less than 50% stenosis - Mild  Right ECA: Less than 50% diameter reduction  Right Vertebral: Normal, Antegrade     Left ICA:  50-69% stenosis - Moderate  Left ECA: Less than 50% diameter reduction  Left Vertebral: Normal, Antegrade      CTA neck: 09/30/2019  CT ANGIO CAROTID-EXTRACRANIAL (NECK) W IV CONTRAST performed on 09/30/2019  10:21 AM.     REASON FOR EXAM:  I65.29: Carotid stenosis     RADIATION DOSE: 178.83 mGy.cm     CONTRAST: 50 ml's of Optiray 320     TECHNIQUE: CTA of the neck.     COMPARISON: November 09, 2018.     FINDINGS: There is mild calcified atherosclerotic disease in the aortic  arch. There is atherosclerotic plaque with mild stenosis at the right  subclavian artery origin. The right common carotid artery is patent without  stenosis. There is grossly unchanged atherosclerotic plaque and  calcification at the right carotid bifurcation and proximal right ICA with  mild stenosis. The left common carotid artery is without stenosis of the  left carotid bifurcation. There is grossly unchanged extensive  atherosclerotic plaque with shallow ulceration at the left carotid  bifurcation and proximal left ICA with around 50% stenosis. The right  subclavian artery is patent without stenosis. On the current study there is  minimal atherosclerotic disease at the left subclavian artery origin  without stenosis. The left vertebral artery slightly larger. No  vertebral  artery stenosis is identified.        Right upper lobectomy is now noted. 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      IMPRESSION:  There is grossly unchanged atherosclerotic plaque with shallow ulceration  at the left carotid bifurcation with about 50% stenosis. Milder  atherosclerotic disease at the right carotid bifurcation  is also grossly  unchanged.        Risk Factors and Comorbid Conditions:  History of previous carotid artery surgery and/or stenting: No  Peripheral Vascular Disease: Atherosclerosis of the extremities: No  With Gangrene: no. Intermittent claudication: no. Rest pain: no.  Wound /Ulcer present: No  Renal Failure/Insuficiency: No  Heart Failure: no   Diabetes Mellitus  (HbA1C >6.5, Fasting >126 or Random glucose >200 with hyperglycemic symptoms):  Yes.  Diabetes Therapy: Diet, Oral Meds    Assessment    1. Carotid Stenosis       Plan:  The patient presents for carotid stenosis surveillance and imaging.   The patient has asymptomatic carotid stenosis   Imaging today discussed in depth with patient.   We will plan to continue surveillance and we will see the patient back in the office in  6 months with carotid duplex.  Discussed smoking cessation with patient  Discussed to continue follow up with [facility] for lung follow up.   Patients will continue medications as prescribed.  Patient was given the opportunity to ask questions and those questions were answered to their satisfaction. Instructed to call with any further questions or concerns.     Patient was seen independently with cosigning faculty, available by phone.     Lew Dawes, APRN,NP-C 09/30/2019, 10:18    I was available to discuss this patient.  I have reviewed and agree with the above note.    Troy Sine, MD  10/01/2019, 17:09

## 2019-10-25 ENCOUNTER — Ambulatory Visit (HOSPITAL_BASED_OUTPATIENT_CLINIC_OR_DEPARTMENT_OTHER): Payer: BC Managed Care – PPO | Admitting: SURGERY OF THE HAND

## 2019-12-06 ENCOUNTER — Ambulatory Visit (HOSPITAL_BASED_OUTPATIENT_CLINIC_OR_DEPARTMENT_OTHER): Payer: BC Managed Care – PPO | Admitting: SURGERY OF THE HAND

## 2020-02-17 ENCOUNTER — Other Ambulatory Visit (HOSPITAL_COMMUNITY): Payer: Self-pay | Admitting: Vascular Surgery

## 2020-03-17 IMAGING — CT CT THORAX WITHOUT CONTRAST
1 series · 15 of 32 positions shown, 19 images · non-contrast
Comparison: None.

﻿EXAM:  CT THORAX WITHOUT CONTRAST
INDICATION: History of right upper lobe cancer, followup exam.
TECHNIQUE: Helical noncontrast CT imaging of the chest was performed.  Exam was performed using one or more of the following dose reduction techniques: Automated exposure control, adjustment of the mA and/or kV according to patient size, or the use of iterative reconstruction technique. Radiation dose 623 mGy cm.

[lung · axial · 0.66mm/px · z∈[-1164,-980]mm · 15 of 79 slices shown, 19 images]
[im 6/79  mediastinal]
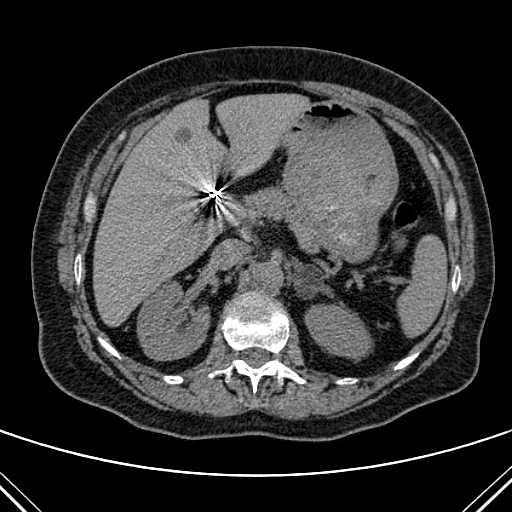
[im 6/79  lung]
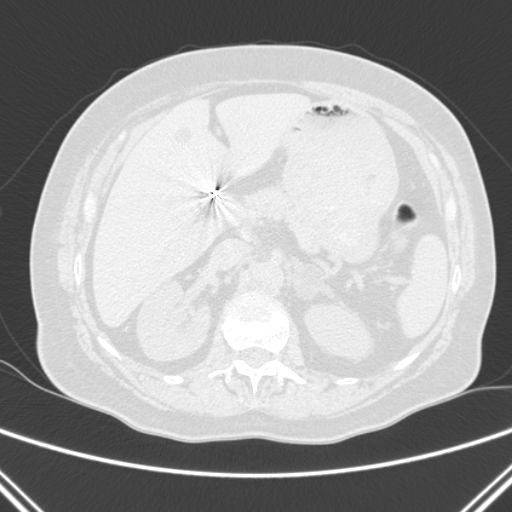
[im 12/79  lung]
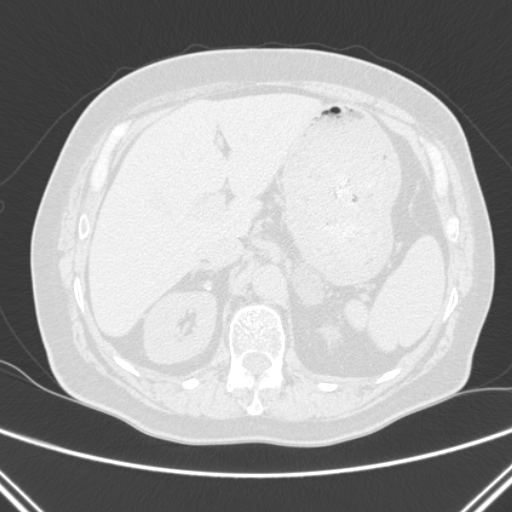
[im 16/79  lung]
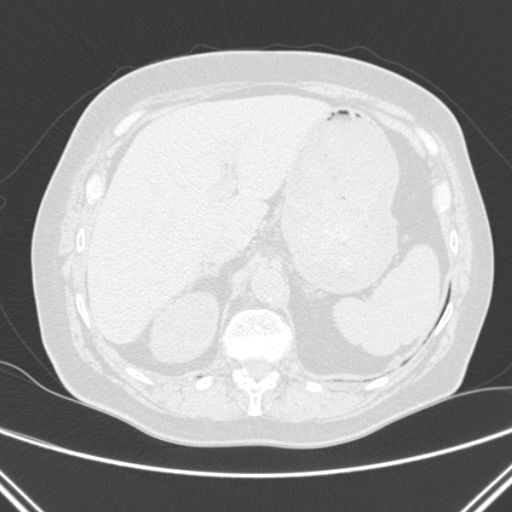
[im 21/79  lung]
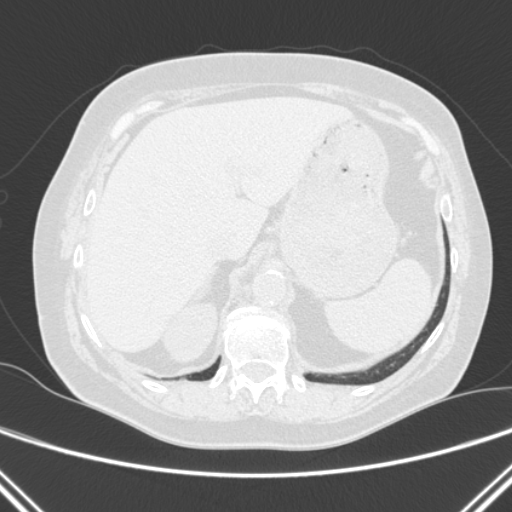
[im 27/79  mediastinal]
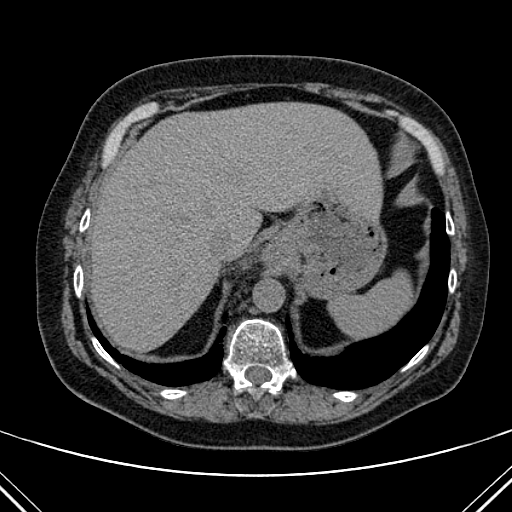
[im 27/79  lung]
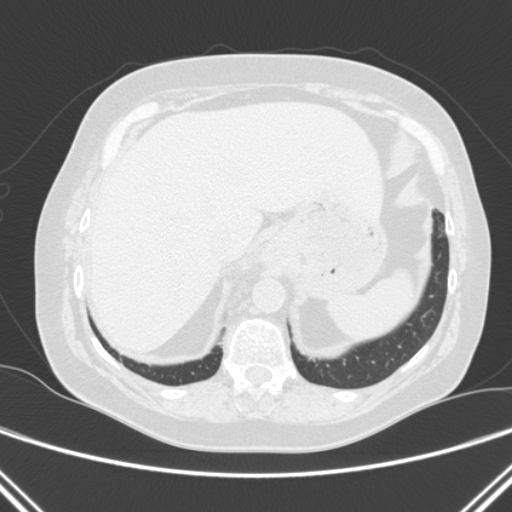
[im 30/79  lung]
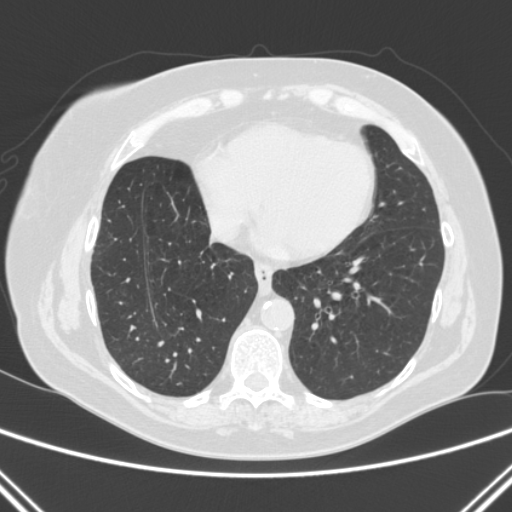
[im 34/79  lung]
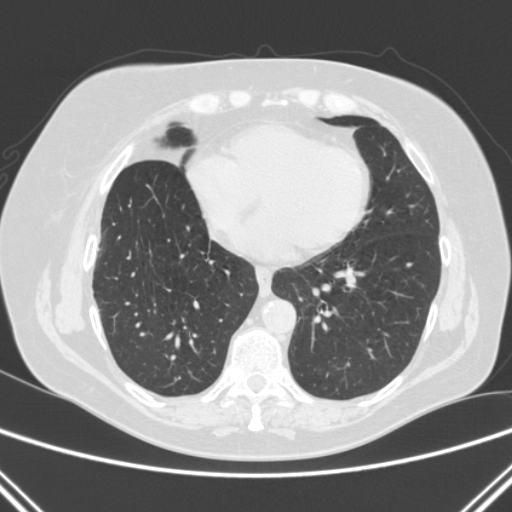
[im 38/79  lung]
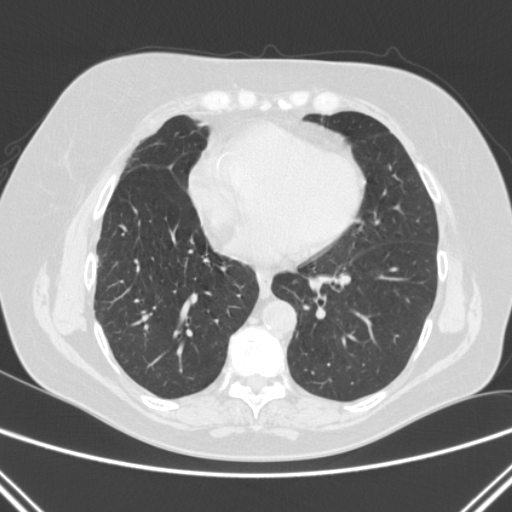
[im 44/79  mediastinal]
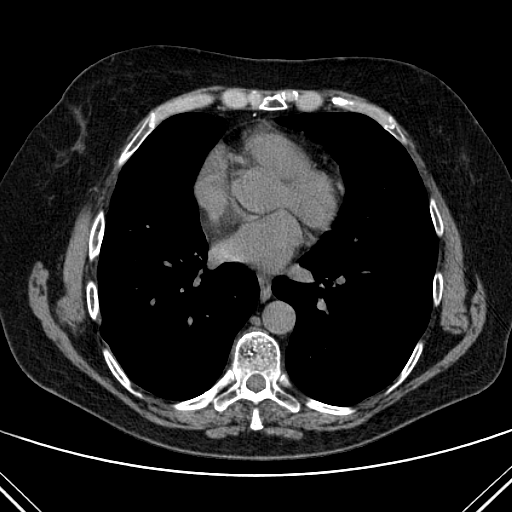
[im 44/79  lung]
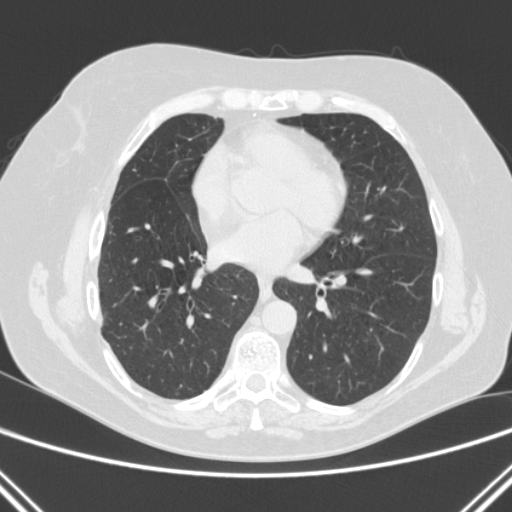
[im 50/79  lung]
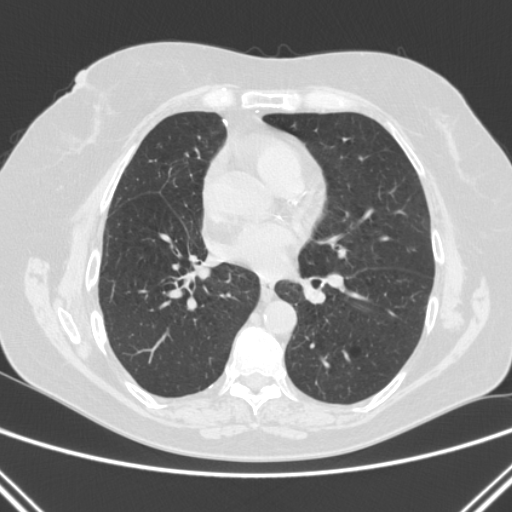
[im 55/79  lung]
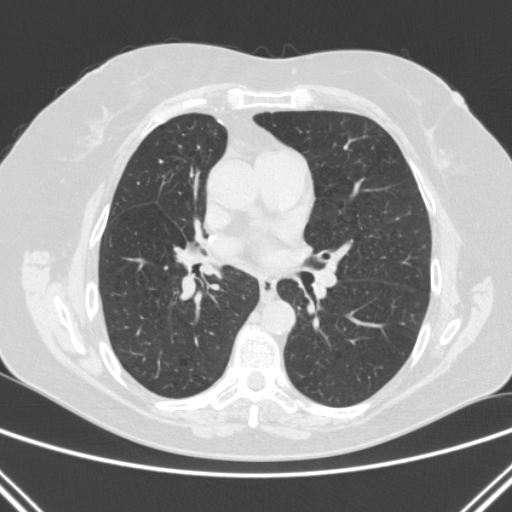
[im 61/79  lung]
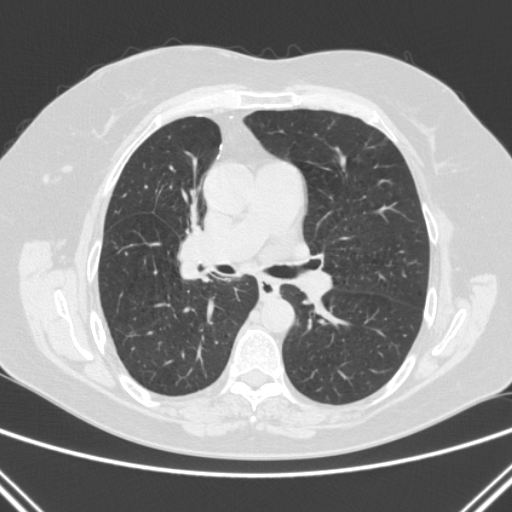
[im 64/79  mediastinal]
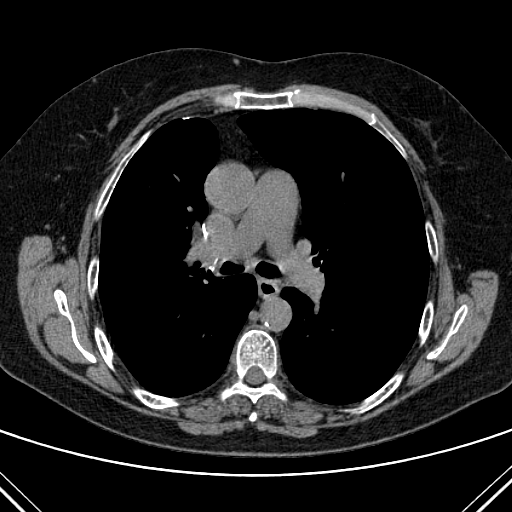
[im 64/79  lung]
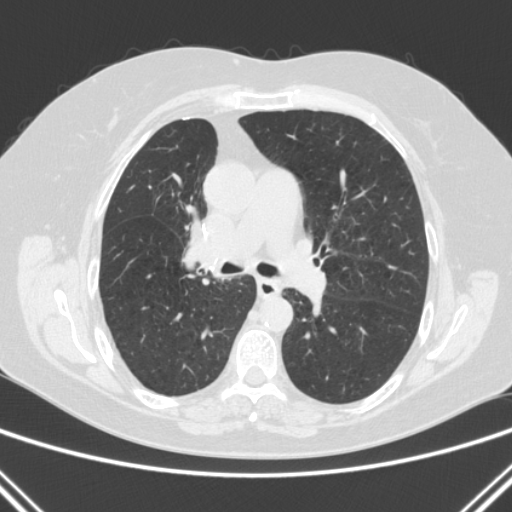
[im 70/79  lung]
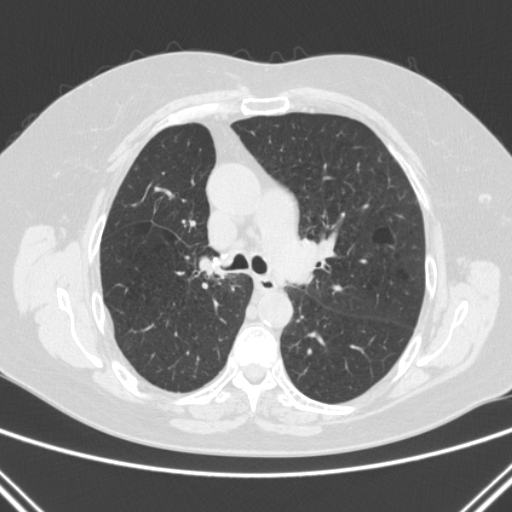
[im 76/79  lung]
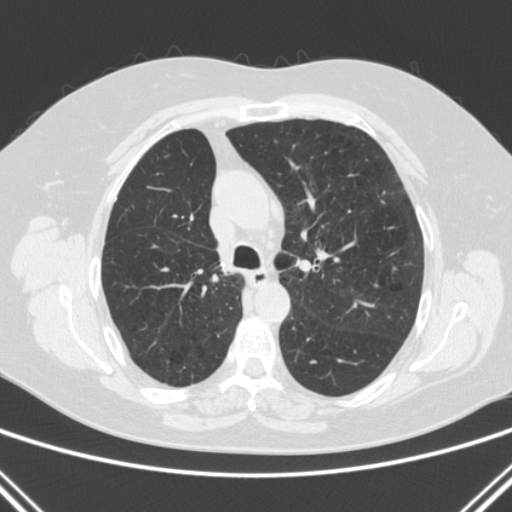

[15 of 32 positions shown; findings below may reference images not displayed]

FINDINGS: Thyroid gland appears unremarkable.  Trachea and mainstem bronchi are patent.  There is no mediastinal or axillary adenopathy.  Evaluation for hilar adenopathy is limited due to lack of intravenous contrast.  Patient appears to be status post right upper lobectomy.  There are mild emphysematous changes within the lungs.  There is no pleural or pericardial effusion.  There is no lung consolidation.  There is no suspicious lung nodule.  There is no aortic aneurysm.  There are scattered vascular calcifications.  There is a small hiatal hernia.  There is also a 12.5 mm cyst within the left hepatic lobe medial segment and a 2.5 cm left adrenal adenoma.  Mild multilevel arthritic changes are seen within the thoracic spine.
IMPRESSION: 1. Postsurgical changes of right upper lobectomy and mild emphysematous changes within the lungs.  No suspicious lung nodule. 

2. No lung consolidation, pleural or pericardial effusion.

## 2020-03-29 ENCOUNTER — Encounter (HOSPITAL_COMMUNITY): Payer: Self-pay | Admitting: Family

## 2020-03-29 ENCOUNTER — Ambulatory Visit (HOSPITAL_COMMUNITY): Payer: BC Managed Care – PPO

## 2020-04-05 ENCOUNTER — Encounter (HOSPITAL_COMMUNITY): Payer: Self-pay | Admitting: Family

## 2020-04-19 ENCOUNTER — Encounter (HOSPITAL_COMMUNITY): Payer: Self-pay | Admitting: Family

## 2020-04-19 ENCOUNTER — Other Ambulatory Visit (HOSPITAL_COMMUNITY): Payer: Self-pay

## 2020-04-19 NOTE — Progress Notes (Unsigned)
Vascular Surgery  Follow-Up Clinic Note                    Date: 04/19/2020  Patient: Kerry Riggs  MRN: [id number]  DOB: Jan 24, 1956  PCP: Mancel Parsons, MD    Chief Complaint: No chief complaint on file.      Subjective:     HPI: Kerry Riggs is a 65 y.o. White female who presents for follow up of carotid stenosis surveillance and imaging.    Denies any new changes in PMHx, PSxH, Social Hx, FHx or medications since last visit    ROS:  General: Denies fevers, chills, sweats or fatigue  Eyes: Denies blurred vision or field defects  HEENT: Denies hearing loss   Respiratory: Denies cough, or shortness of breath  Cardiovascular: Denies chest pain or claudication  Gastrointestinal: Denies postprandial pain, nausea, vomiting, or problems with bowel movements.   Genitourinary: Denies dysuria, frequency, or trouble urinating   Musculoskeletal: As in HPI  Neurological: Denies unilateral weakness or monocular blindness  Integumentary: Denies wounds or ulcers  All other systems were negative     Allergies   Allergen Reactions   . Penicillins    . Sulfa (Sulfonamides)        Current Outpatient Medications   Medication Sig Dispense Refill   . aspirin (ECOTRIN) 81 mg Oral Tablet, Delayed Release (E.C.) Take 81 mg by mouth Once a day     . atorvastatin (LIPITOR) 20 mg Oral Tablet Take 1 Tab (20 mg total) by mouth Once a day 90 Tab 4   . Candesartan-Hydrochlorothiazid 32-25 mg Oral Tablet Patient Takes 1/2 tab twice daily     . Cholecalciferol, Vitamin D3, (VITAMIN D) 25 mcg (1,000 unit) Oral Capsule Take by mouth     . Coenzyme Q10 (CO Q-10) 10 mg Oral Capsule Take by mouth     . ferrous sulfate (FERATAB) 324 mg (65 mg iron) Oral Tablet, Delayed Release (E.C.) Take 324 mg by mouth     . L. acidophilus/L. rhamnosus (PROBIOTIC ORAL) Take by mouth     . levothyroxine (SYNTHROID) 88 mcg Oral Tablet TAKE 1 TABLET BY MOUTH ONCE DAILY     . loratadine (CLARITIN) 10 mg Oral Tablet Take 10 mg by mouth Once a day     . metFORMIN  (GLUCOPHAGE) 500 mg Oral Tablet TAKE 1 TABLET BY MOUTH ONCE DAILY     . metoprolol succinate (TOPROL-XL) 25 mg Oral Tablet Sustained Release 24 hr Take 12 mg by mouth Once a day Pt states that she takes 12 mg occasionally.     . montelukast (SINGULAIR) 10 mg Oral Tablet TAKE 1 TABLET BY MOUTH ONCE DAILY IN THE EVENING       No current facility-administered medications for this visit.       Cardiovascular Medications:  ASA: {YES/NO:19957}  Statin: {YES/NO:19957}  Antiplatelets (Plavix, Brilinta, Effient): {YES/NO:19957}  Anticoagulant: {YES/NO:19957}    Objective:    Physical Exam:   Vitals: There were no vitals taken for this visit.      Constitutional: AA&O X3 Well developed and well nourished in no acute distress   HENT: Head is normocephalic, atraumatic   Eyes: Conjunctiva clear, Pupils equal, round and reactive to light; Sclera without icterus  Neck: Normal ROM, Supple, symmetrical  Respiratory: Effort normal, unlabored  Cardiovascular: Heart regular rate and rhythm,   Extremities: {Extremities:20958}  Integumentary:  {Integumentary:20867}  Neurologic: Grossly normal, no focal neuro deficit, normal coordination and gait  Psychiatric: normal  affect, behavior, memory, thought content, judgement, and speech.    Vascular Exam:    Carotid Bruits: {YES/NO:19957}    Left Right   Brachial artery: {Arterial pulse strength:19557}  Radial artery: {Arterial pulse strength:19557}   Brachial artery: {Arterial pulse strength:19557}  Radial artery: {Arterial pulse strength:19557}       Laboratory:       Imaging Tests:  Carotid duplex: official read pending        Risk Factors and Comorbid Conditions:  History of previous carotid artery surgery and/or stenting: No  Peripheral Vascular Disease: Atherosclerosis of the extremities: No  With Gangrene: no. Intermittent claudication: no. Rest pain: no.  Wound /Ulcer present: {YES/NO:19957}  Renal Failure/Insuficiency: {[place] IP Heart Failure Renal Failure Yes No If yes  type:28372}  Heart Failure: {YES/NO:21365} {Heart failure_____:[id number]}  Diabetes Mellitus  (HbA1C >6.5, Fasting >126 or Random glucose >200 with hyperglycemic symptoms):  {YES NO DM:50023426}    Assessment    1. Carotid Stenosis     Plan:  The patient presents for carotid stenosis surveillance and imaging     Imaging today discussed with patient.   We will plan to continue surveillance and we will see the patient back in the office in  {gs1_[id number]_6_12:21469}{WEEKS/MONTHS:20282}.  Patients will continue medications as prescribed.  Patient was given the opportunity to ask questions and those questions were answered to their satisfaction. Instructed to call with any further questions or concerns.     Patient was seen independently with cosigning faculty, in clinic.     Lew Dawes, APRN,NP-C 04/19/2020, 09:25

## 2020-06-28 IMAGING — MR MRI THORACIC SPINE WITHOUT CONTRAST
4 of 6 series · 26 of 48 positions shown · IV contrast (gadolinium)
Comparison: None.

﻿EXAM:  85445   MRI THORACIC SPINE WITHOUT CONTRAST
INDICATION: Mid back pain.
TECHNIQUE: Multiplanar, multisequential MRI of the thoracic spine was performed without gadolinium contrast.

[Series 5: T2 · sagittal · 4.0mm · 0.78mm/px · 8 of 13 slices shown (1 of 2)]
[im 1/13]
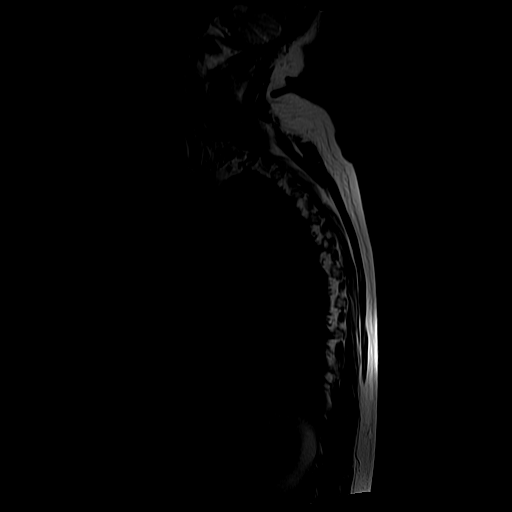
[im 2/13]
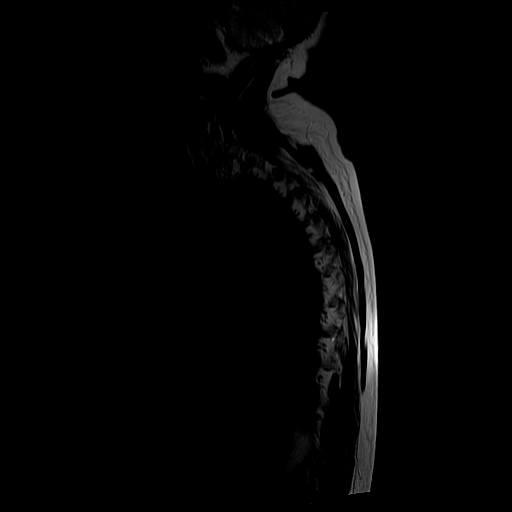
[im 4/13]
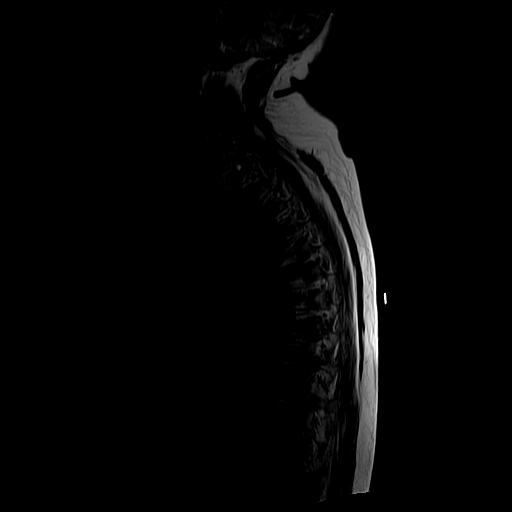
[im 6/13]
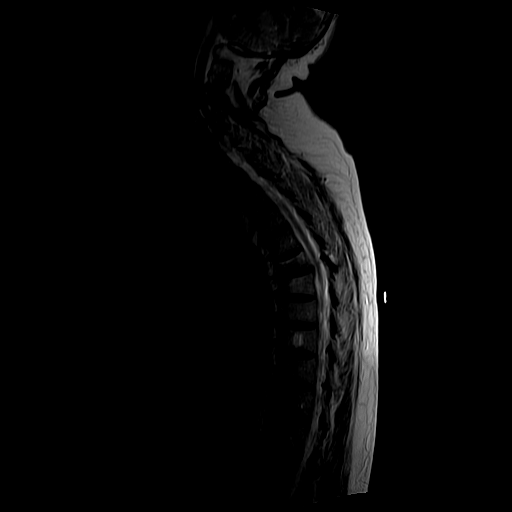
[im 7/13]
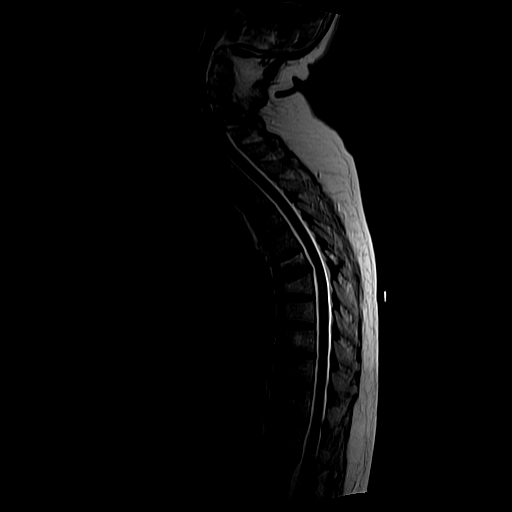
[im 9/13]
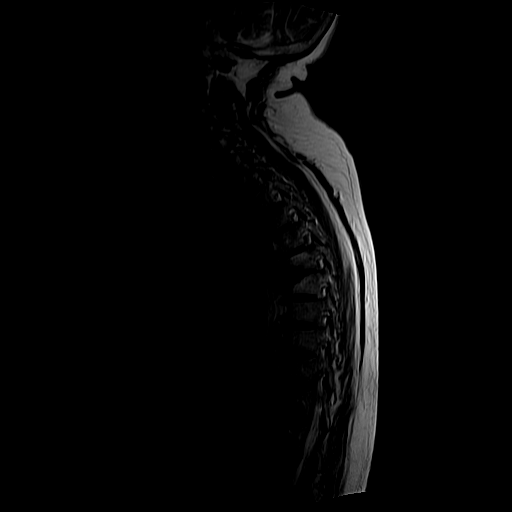
[im 11/13]
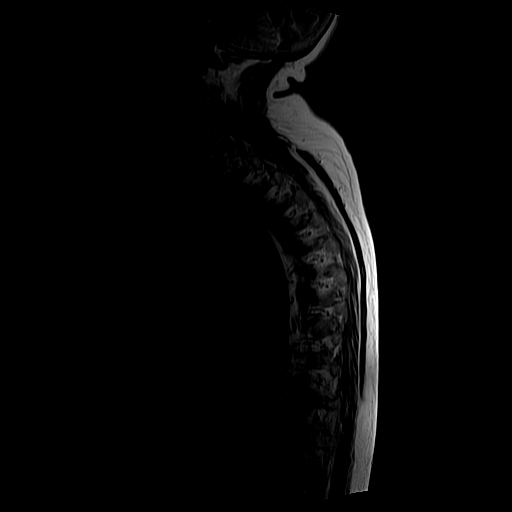
[im 13/13]
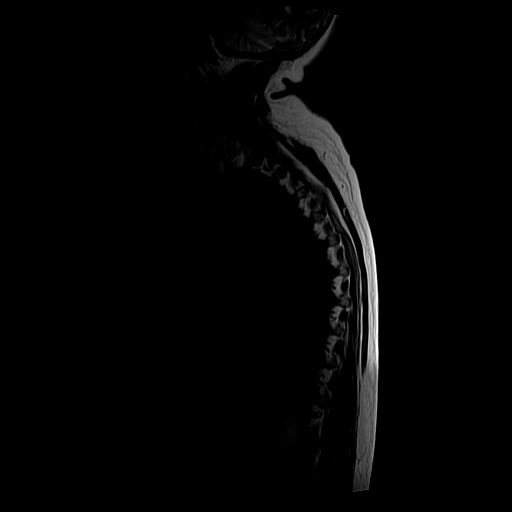

[Series 6: T1 · sagittal · 4.0mm · 0.78mm/px · 7 of 13 slices shown (1 of 2)]
[im 1/13]
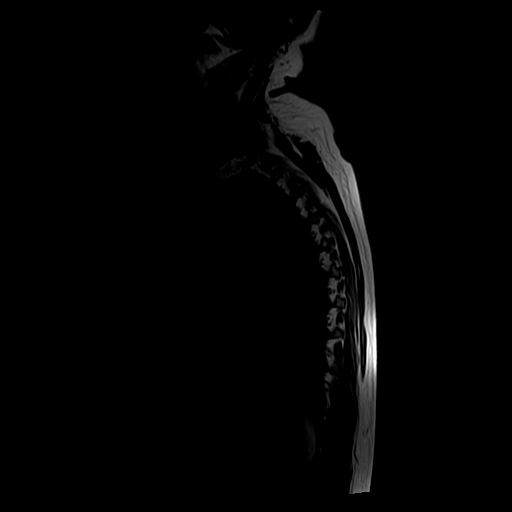
[im 2/13]
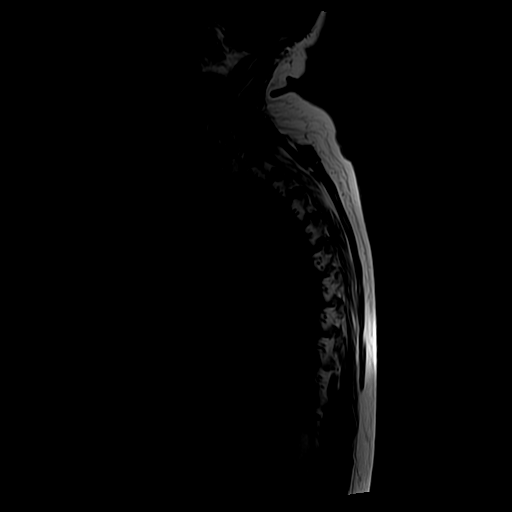
[im 4/13]
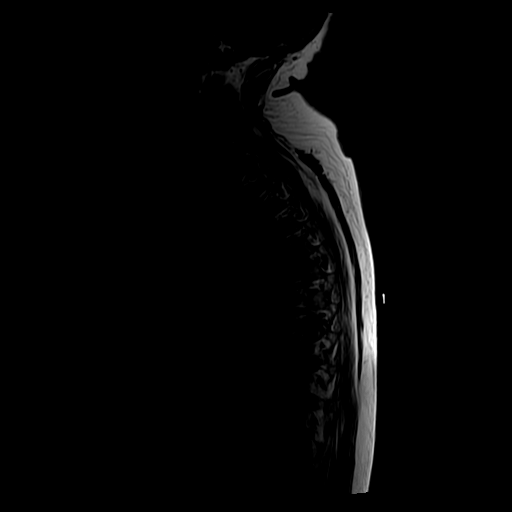
[im 6/13]
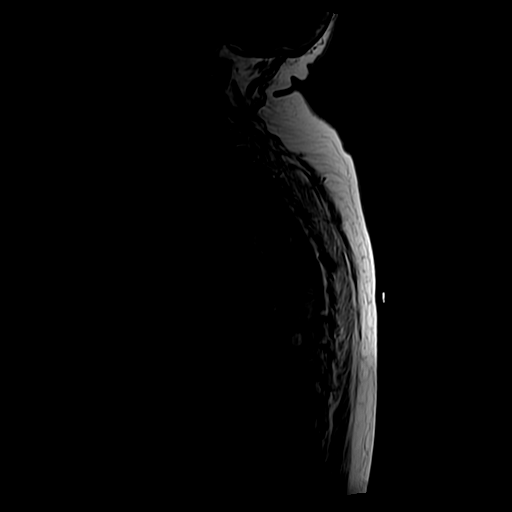
[im 7/13]
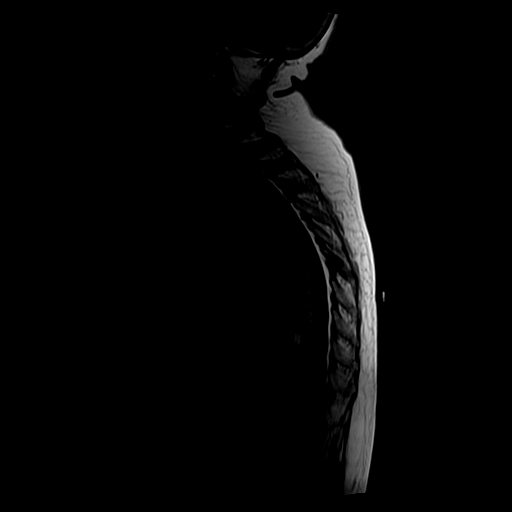
[im 9/13]
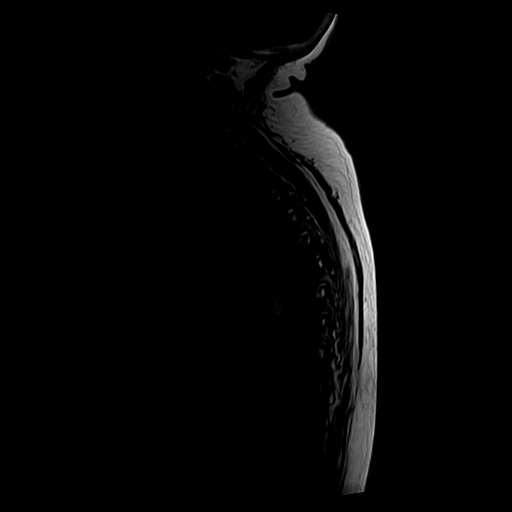
[im 11/13]
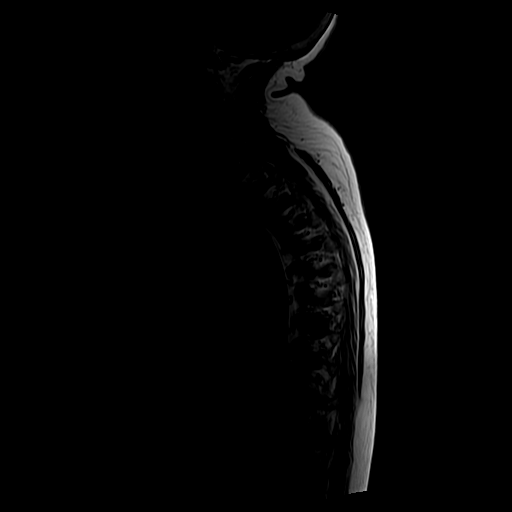

[Series 9: T2 · axial · 4.0mm · 0.62mm/px · z∈[-280,-141]mm · 8 of 12 slices shown (2 of 2)]
[im 1/12]
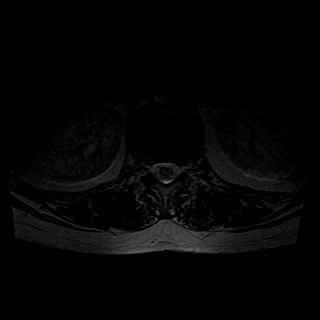
[im 2/12]
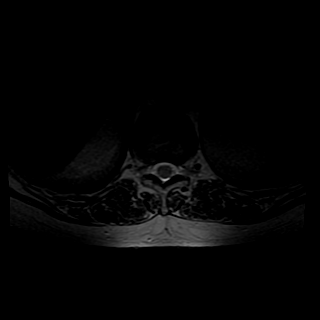
[im 4/12]
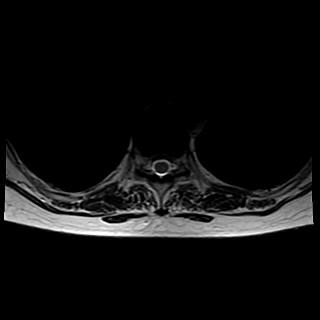
[im 5/12]
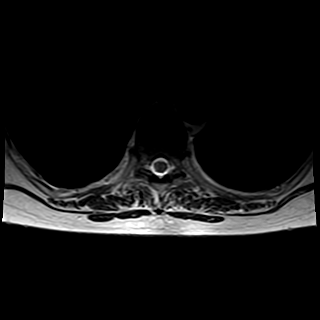
[im 7/12]
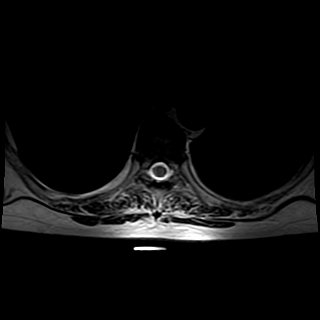
[im 8/12]
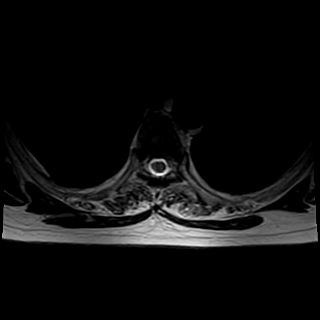
[im 10/12]
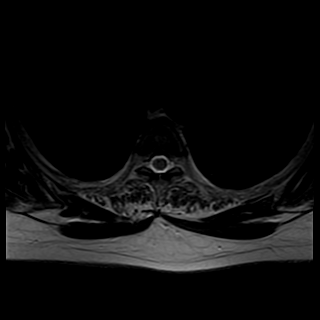
[im 12/12]
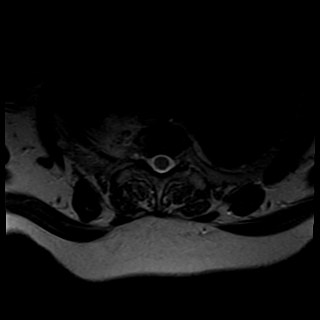

[Series 10: T1 · axial · 4.0mm · 0.62mm/px · z∈[-257,-158]mm · 3 of 12 slices shown (2 of 2)]
[im 2/12]
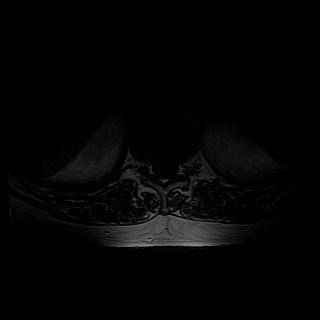
[im 7/12]
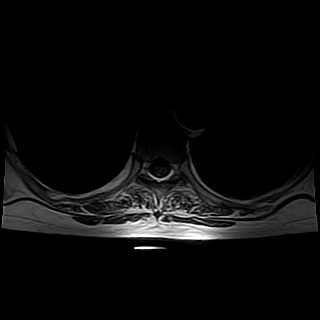
[im 10/12]
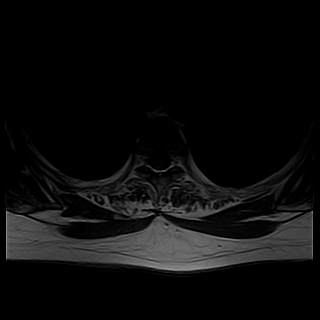

[26 of 48 positions shown; findings below may reference images not displayed]

FINDINGS: There are mild subacute T6 and T7 superior endplate compression fractures.  There is also suggestion of moderate to severe spinal stenosis at C4-C5, C5-C6, and C6-C7 levels.  Visualized spinal cord is normal in signal intensity without evidence of compression at any level. 

There is no significant disc herniation, spinal canal or neural foraminal stenosis at any level within the thoracic spine.  

Paraspinal soft tissues are unremarkable.  There is no pleural effusion.
IMPRESSION: 1. Mild subacute T6 and T7 superior endplate compression fractures.  

2. Suggestion of moderate to severe spinal stenosis at C4-C5, C5-C6 and C6-C7 levels.  This is incompletely assessed on this exam and a dedicated MRI of the cervical spine is recommended for better assessment. 

:

## 2020-12-13 ENCOUNTER — Ambulatory Visit: Payer: BC Managed Care – PPO

## 2020-12-13 ENCOUNTER — Ambulatory Visit (INDEPENDENT_AMBULATORY_CARE_PROVIDER_SITE_OTHER): Payer: Self-pay | Admitting: Ophthalmology

## 2020-12-25 ENCOUNTER — Encounter (INDEPENDENT_AMBULATORY_CARE_PROVIDER_SITE_OTHER): Payer: Self-pay | Admitting: Cornea and External Diseases Specialist

## 2020-12-25 ENCOUNTER — Other Ambulatory Visit: Payer: Self-pay

## 2020-12-25 ENCOUNTER — Ambulatory Visit
Payer: BC Managed Care – PPO | Attending: Cornea and External Diseases Specialist | Admitting: Cornea and External Diseases Specialist

## 2020-12-25 DIAGNOSIS — G4730 Sleep apnea, unspecified: Secondary | ICD-10-CM | POA: Insufficient documentation

## 2020-12-25 DIAGNOSIS — H18839 Recurrent erosion of cornea, unspecified eye: Secondary | ICD-10-CM | POA: Insufficient documentation

## 2020-12-25 DIAGNOSIS — H0259 Other disorders affecting eyelid function: Secondary | ICD-10-CM | POA: Insufficient documentation

## 2020-12-25 MED ORDER — SODIUM CHLORIDE 5 % EYE OINTMENT
TOPICAL_OINTMENT | Freq: Every day | OPHTHALMIC | 1 refills | Status: DC
Start: 2020-12-25 — End: 2020-12-25

## 2020-12-25 MED ORDER — SODIUM CHLORIDE 5 % EYE OINTMENT
TOPICAL_OINTMENT | Freq: Every evening | OPHTHALMIC | 1 refills | Status: AC
Start: 2020-12-25 — End: 2021-01-24

## 2020-12-25 NOTE — Progress Notes (Signed)
[facility]  [address]  [place] [address]  Operated by [facility]         Patient Name: Kerry Riggs  MRN#: [id number]  Birthdate: 12/21/1955    Date of Service: 12/25/2020    Chief Complaint     Red Eye          Kerry Riggs is a 64 y.o. female who presents today for evaluation/consultation of:  HPI     Patient is being referred here by Dr. Kendall Riggs. States that this started 25 years ago. States that they thought she had Sjogren's but she does not. States that she will wake up with a red swollen eye. It will feel like there is vasiline on her eye. States that it will feel like there is sand in her eyes. States that this can be either eye but never at the same time. States that she will have severe pain and will have to go to a doctor. States that they normally give her erythromycin ointment and it will clear up in a couple days. States that it is intermittent. States that she can go 6-7 months and this not happen. States that she is using a OTC drop OU TID- last used last night. States that she has a lot of trouble with her bones, some days she has trouble walking, possibly MS vs arthritis. States that they are not sure.   States that she is diabetic- for 1 year now, A1C stays under 6.4- on metformin. States that she also has problems with thyroid and takes synthroid.   Not on Plaquinel.    Last edited by Kerry Riggs on 12/25/2020  3:36 PM. (History)        ROS     Positive for: Eyes    Negative for: Constitutional, Gastrointestinal, Neurological, Skin, Genitourinary, Musculoskeletal, HENT, Endocrine, Cardiovascular, Respiratory, Psychiatric, Allergic/Imm, Heme/Lymph    Last edited by Kerry Riggs on 12/25/2020  3:32 PM. (History)         All other systems Negative  Base Eye Exam     Visual Acuity (Snellen - Linear)       Right Left    Dist sc 20/20 -1 20/20 -2          Tonometry (Tonopen, 5:15 PM)       Right Left    Pressure 19 17   Defer to  MD view           Pupils       APD    Right None    Left None          Visual Fields (Counting fingers)       Right Left     Full Full          Extraocular Movement       Right Left     Full Full          Neuro/Psych     Oriented x3: Yes    Mood/Affect: Normal            Slit Lamp and Fundus Exam     Slit Lamp Exam       Right Left    Lids/Lashes Floppy eyelid Floppy eyelid    Conjunctiva/Sclera White and quiet White and quiet    Cornea Clear, no negative staining/irregularity Clear, no negative staining    Anterior Chamber Deep and quiet Deep and quiet    Iris Round and reactive Round  and reactive    Lens Posterior chamber intraocular lens Posterior chamber intraocular lens          Fundus Exam       Right Left    Disc Normal Normal    C/D Ratio 0.2 0.25    Macula Normal Normal    Vessels defered per pt defered per pt                MD Addition to HPI: Pt c/o recurrent episodes of redness, irritation in both eyes, alternating. Recently more episodes of recurrence in right eye. She has been using artificial tears prn and reports no improvement. She was referred to rheumatology for possible connective tissue disease, Sjogrens syndrome. Work up was negative for rheum disease.   She has h/o sleep apnea . Prefers to sleep on right side. Denies use of CPAP.          ENCOUNTER DIAGNOSES     ICD-10-CM   1. 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 Recurrent erosion of cornea, unspecified laterality, r/o  H18.839     No orders of the defined types were placed in this encounter.      Ophthalmic Plan of Care:  1 &2  Lid laxity of upper and lower lids OU  3.    R/o recurrent erosion syndrome  Symptoms recurring in the mornings  No signs of ABMD on exam today  No superficial punctate keratopathy, negative staining on exam today    - Muro 128 5% ointment at bedtime  -PF AT QID  - Googles provided to pt today to wear at bedtime.      Follow up:    I have asked Kerry Riggs  to follow up  in 4 weeks    I have seen and examined the above patient. I discussed the above diagnoses listed in the assessment and the above ophthalmic plan of care with the patient and patient's family. All questions were answered. I reviewed and, when necessary, made changes to the technician/resident note, documented ophthalmology exam, chief complaint, history of present illness, allergies, review of systems, past medical, past surgical, family and social history. I personally reviewed and interpreted all testing and/or imaging performed at this visit and agree with the resident's or fellow's interpretation. Any exceptions/additions are edited/noted in the relevant encounter fields.      Kerry Church, MD  12/25/2020, 21:41

## 2020-12-25 NOTE — Progress Notes (Signed)
[facility]  [address]  [place] [address]  Operated by [facility]         Patient Name: Kerry Riggs  MRN#: [id number]  Birthdate: 12/28/1955    Date of Service: 12/25/2020    Chief Complaint     Red Eye          [name] is a 65 y.o. female who presents today for evaluation/consultation of:  HPI     Patient is being referred here by Dr. Kendall Flack. States that this started 25 years ago. States that they thought she had Sjogren's but she does not. States that she will wake up with a red swollen eye. It will feel like there is vasiline on her eye. States that it will feel like there is sand in her eyes. States that this can be either eye but never at the same time. States that she will have severe pain and will have to go to a doctor. States that they normally give her erythromycin ointment and it will clear up in a couple days. States that it is intermittent. States that she can go 6-7 months and this not happen. States that she is using a OTC drop OU TID- last used last night. States that she has a lot of trouble with her bones, some days she has trouble walking, possibly MS vs arthritis. States that they are not sure.   States that she is diabetic- for 1 year now, A1C stays under 6.4- on metformin. States that she also has problems with thyroid and takes synthroid.   Not on Plaquinel.    Last edited by Nyoka Lint on 12/25/2020  3:36 PM. (History)        ROS     Positive for: Eyes    Negative for: Constitutional, Gastrointestinal, Neurological, Skin, Genitourinary, Musculoskeletal, HENT, Endocrine, Cardiovascular, Respiratory, Psychiatric, Allergic/Imm, Heme/Lymph    Last edited by Nyoka Lint on 12/25/2020  3:32 PM. (History)         All other systems Negative    Laci Earlie Server  12/25/2020, 15:36

## 2021-01-22 ENCOUNTER — Encounter (INDEPENDENT_AMBULATORY_CARE_PROVIDER_SITE_OTHER): Payer: Self-pay | Admitting: Cornea and External Diseases Specialist

## 2021-08-22 IMAGING — MR MRI THORACIC SPINE WITHOUT CONTRAST
4 of 5 series · 26 of 48 positions shown · IV contrast (gadolinium)
Comparison: MRI dated 06/28/2020.

﻿EXAM:  [id number]   MRI THORACIC SPINE WITHOUT CONTRAST
INDICATION: Worsening mid back pain.
TECHNIQUE: Multiplanar multisequential MRI of the thoracic spine was performed without gadolinium contrast.

[Series 5: T2 · sagittal · 4.0mm · 0.78mm/px · 8 of 13 slices shown (1 of 2)]
[im 1/13]
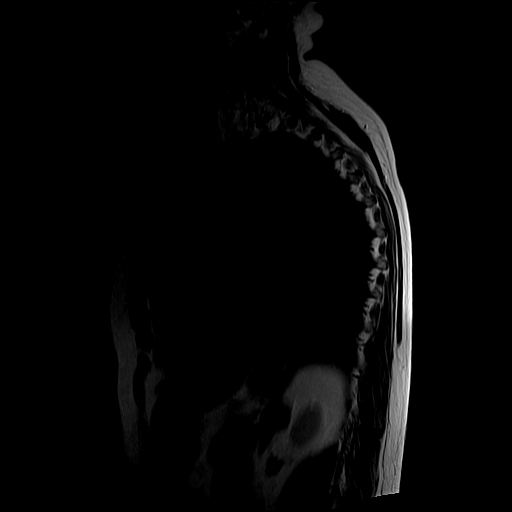
[im 2/13]
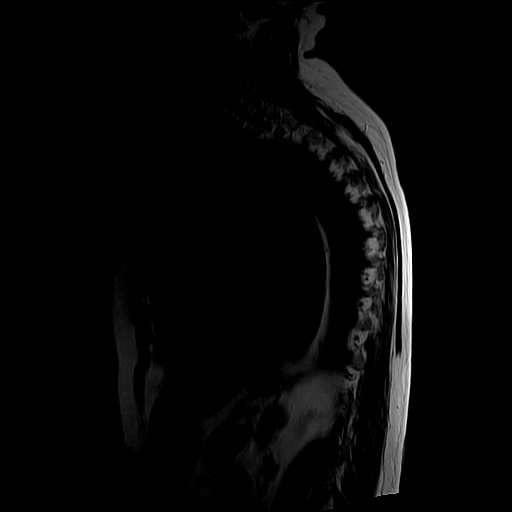
[im 5/13]
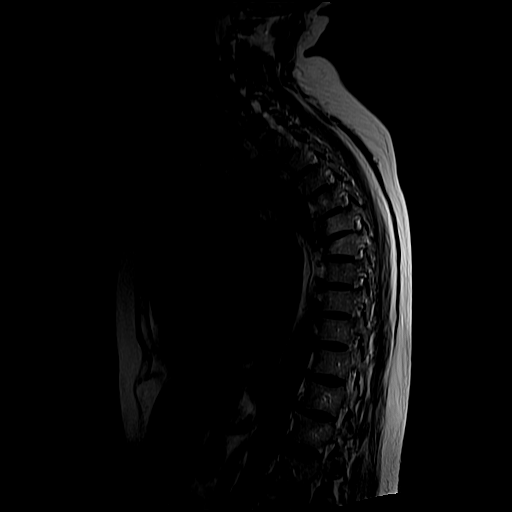
[im 6/13]
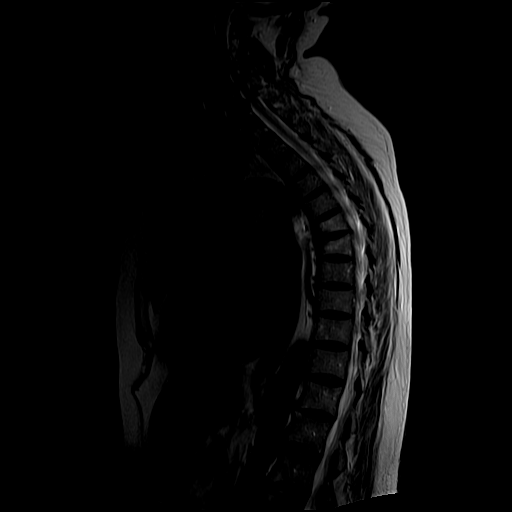
[im 7/13]
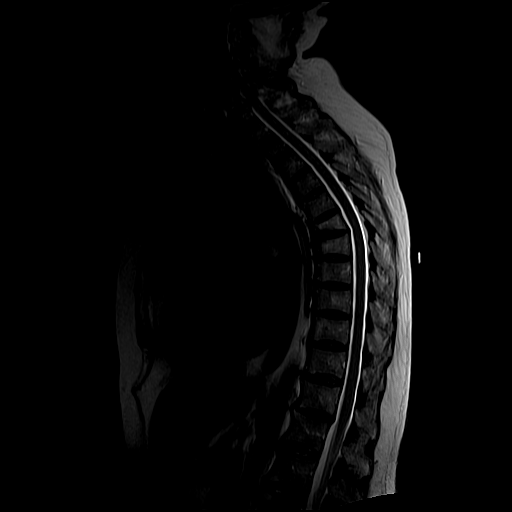
[im 9/13]
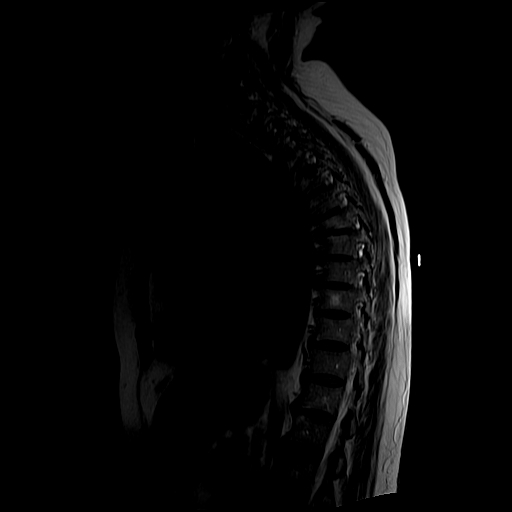
[im 11/13]
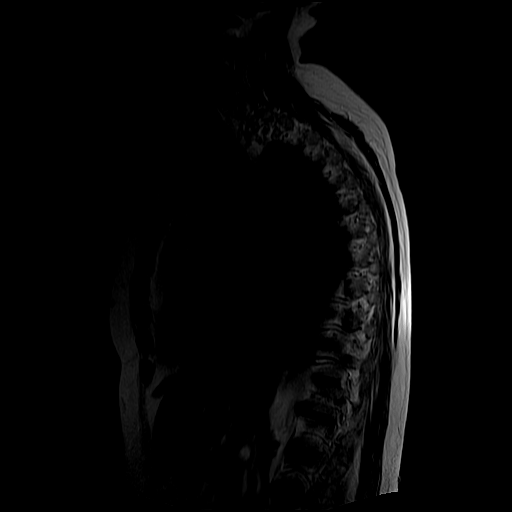
[im 13/13]
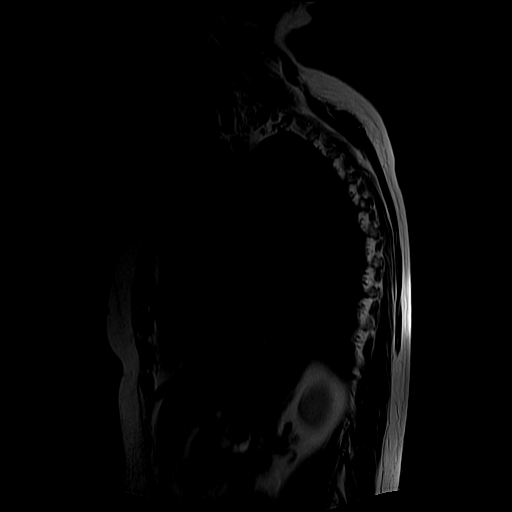

[Series 6: T1 · sagittal · 4.0mm · 0.78mm/px · 6 of 13 slices shown (1 of 2)]
[im 1/13]
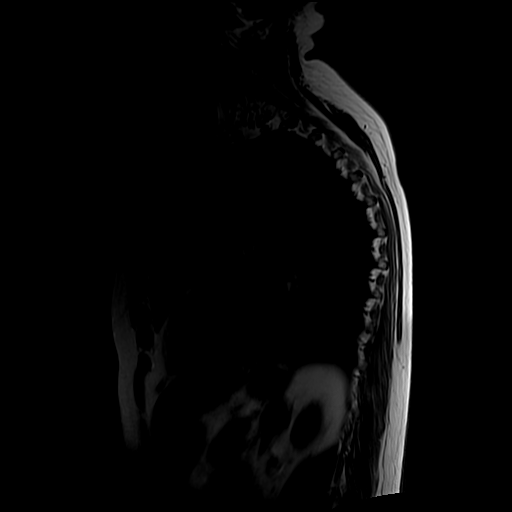
[im 2/13]
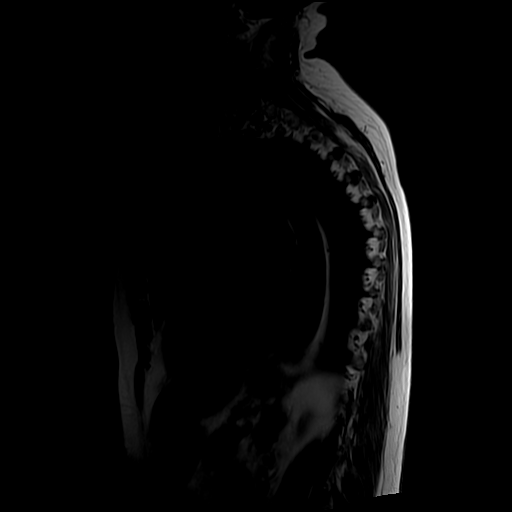
[im 5/13]
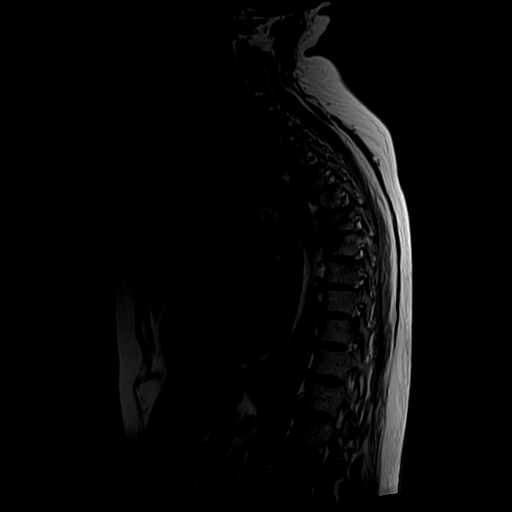
[im 6/13]
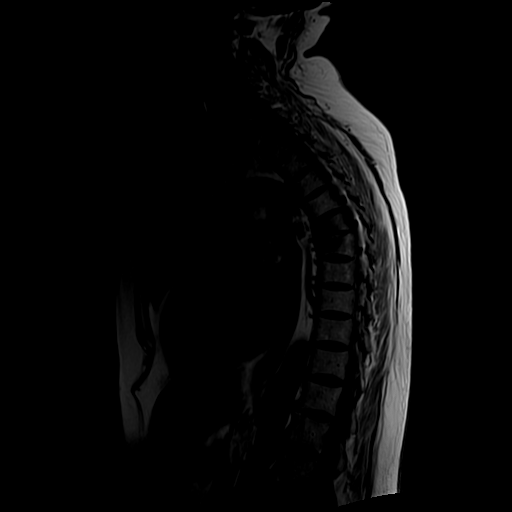
[im 7/13]
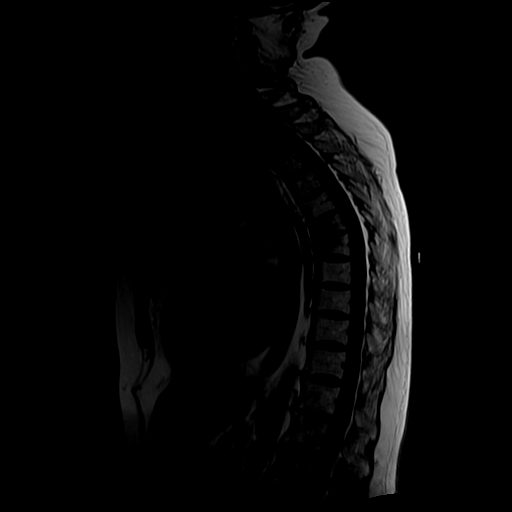
[im 11/13]
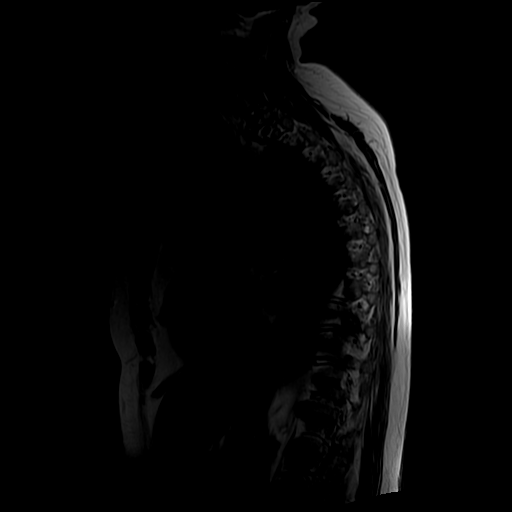

[Series 8: T2 · axial · 4.0mm · 0.62mm/px · z∈[-224,-97]mm · 9 of 12 slices shown (2 of 2)]
[im 1/12]
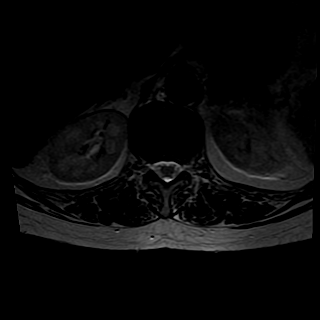
[im 2/12]
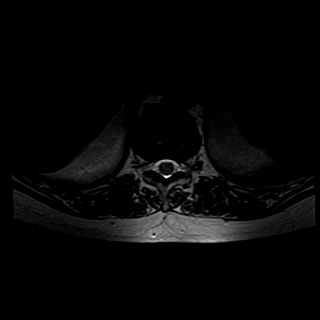
[im 3/12]
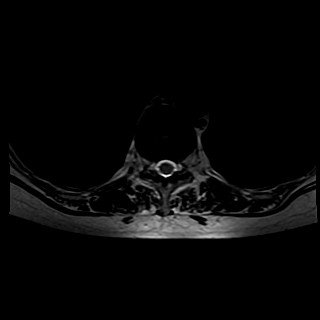
[im 5/12]
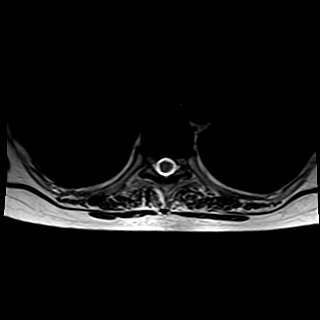
[im 6/12]
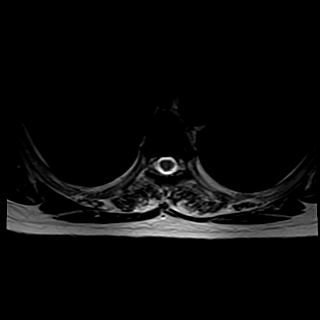
[im 7/12]
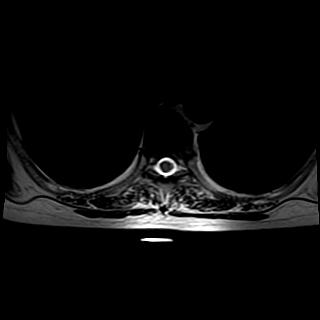
[im 9/12]
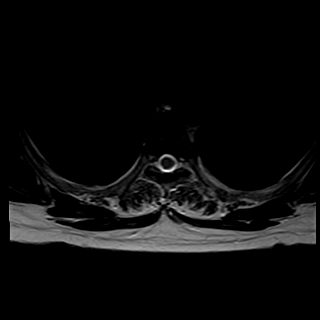
[im 10/12]
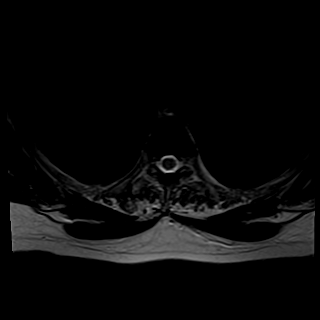
[im 12/12]
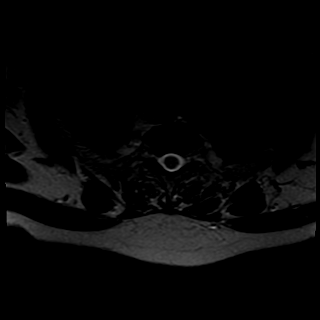

[Series 9: T1 · axial · 4.0mm · 0.62mm/px · z∈[-202,-105]mm · 3 of 12 slices shown (2 of 2)]
[im 2/12]
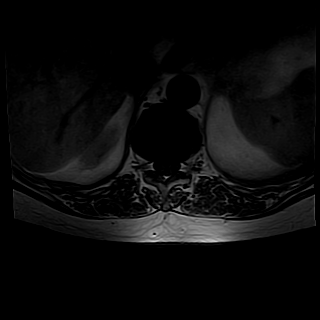
[im 6/12]
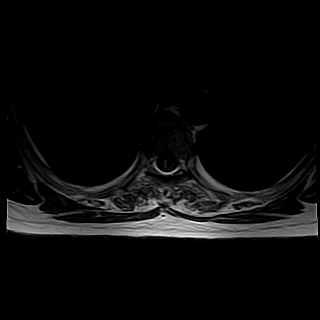
[im 10/12]
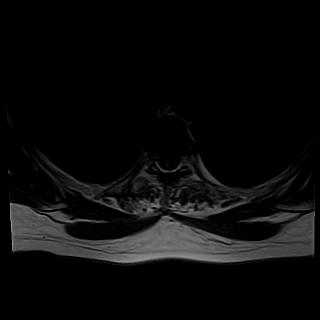

[26 of 48 positions shown; findings below may reference images not displayed]

FINDINGS: There appears to be progressive compression of T6 and T7 vertebral bodies since the previous exam with persistent edema. No new vertebral body fracture is seen.  Moderate to severe spinal stenosis at C4-5, C5-6 and C6-7 levels is again noted.

There is no significant disc herniation, spinal canal or neural foraminal stenosis at any level within the thoracic spine. Paraspinal soft tissues are also unremarkable. There is no pleural effusion.
IMPRESSION: 1. Progressive compression of T6 and T7 vertebral bodies with persistent edema. 

2. Stable moderate to severe spinal stenosis at C4-5, C5-6 and C6-7 levels. Dedicated MRI of the cervical spine is recommended for better assessment.

## 2022-11-05 ENCOUNTER — Other Ambulatory Visit (HOSPITAL_COMMUNITY): Payer: Self-pay

## 2022-11-05 DIAGNOSIS — I6523 Occlusion and stenosis of bilateral carotid arteries: Secondary | ICD-10-CM

## 2023-11-19 ENCOUNTER — Ambulatory Visit (INDEPENDENT_AMBULATORY_CARE_PROVIDER_SITE_OTHER): Payer: Self-pay | Admitting: Ophthalmology

## 2023-12-17 ENCOUNTER — Ambulatory Visit (INDEPENDENT_AMBULATORY_CARE_PROVIDER_SITE_OTHER): Payer: Self-pay | Admitting: Ophthalmology

## 2024-01-07 ENCOUNTER — Ambulatory Visit (INDEPENDENT_AMBULATORY_CARE_PROVIDER_SITE_OTHER): Payer: Self-pay | Admitting: Ophthalmology

## 2024-02-18 ENCOUNTER — Ambulatory Visit (INDEPENDENT_AMBULATORY_CARE_PROVIDER_SITE_OTHER): Payer: Self-pay | Admitting: Ophthalmology

## 2024-02-18 ENCOUNTER — Encounter (INDEPENDENT_AMBULATORY_CARE_PROVIDER_SITE_OTHER): Payer: Self-pay

## 2024-03-17 ENCOUNTER — Ambulatory Visit (INDEPENDENT_AMBULATORY_CARE_PROVIDER_SITE_OTHER): Admitting: Ophthalmology

## 2024-03-17 ENCOUNTER — Encounter (INDEPENDENT_AMBULATORY_CARE_PROVIDER_SITE_OTHER): Payer: Self-pay
